# Patient Record
Sex: Female | Born: 1970 | Race: White | Hispanic: No | Marital: Married | Smoking: Never smoker
Health system: Southern US, Community
[De-identification: ages and names within clinical notes are randomized; demographics above are authoritative.]

## PROBLEM LIST (undated history)

## (undated) DIAGNOSIS — T8859XA Other complications of anesthesia, initial encounter: Secondary | ICD-10-CM

## (undated) DIAGNOSIS — Z87442 Personal history of urinary calculi: Secondary | ICD-10-CM

## (undated) DIAGNOSIS — Z9889 Other specified postprocedural states: Secondary | ICD-10-CM

## (undated) DIAGNOSIS — N200 Calculus of kidney: Secondary | ICD-10-CM

## (undated) DIAGNOSIS — I519 Heart disease, unspecified: Secondary | ICD-10-CM

## (undated) DIAGNOSIS — I872 Venous insufficiency (chronic) (peripheral): Secondary | ICD-10-CM

## (undated) DIAGNOSIS — K219 Gastro-esophageal reflux disease without esophagitis: Secondary | ICD-10-CM

## (undated) DIAGNOSIS — F32A Depression, unspecified: Secondary | ICD-10-CM

## (undated) DIAGNOSIS — N189 Chronic kidney disease, unspecified: Secondary | ICD-10-CM

## (undated) DIAGNOSIS — R112 Nausea with vomiting, unspecified: Secondary | ICD-10-CM

## (undated) DIAGNOSIS — I739 Peripheral vascular disease, unspecified: Secondary | ICD-10-CM

## (undated) DIAGNOSIS — Z8669 Personal history of other diseases of the nervous system and sense organs: Secondary | ICD-10-CM

## (undated) HISTORY — PX: TONSILLECTOMY: SUR1361

## (undated) HISTORY — DX: Venous insufficiency (chronic) (peripheral): I87.2

## (undated) HISTORY — DX: Calculus of kidney: N20.0

## (undated) HISTORY — PX: KIDNEY SURGERY: SHX687

## (undated) HISTORY — DX: Chronic kidney disease, unspecified: N18.9

## (undated) HISTORY — DX: Gastro-esophageal reflux disease without esophagitis: K21.9

## (undated) HISTORY — DX: Heart disease, unspecified: I51.9

## (undated) HISTORY — PX: BARIATRIC SURGERY: SHX1103

## (undated) NOTE — *Deleted (*Deleted)
PHARMACY CONSULT FOR:  Risk Assessment for Post-Discharge VTE Following Bariatric Surgery  Post-Discharge VTE Risk Assessment: This patient's probability of 30-day post-discharge VTE is increased due to the factors marked:   Female    Age >/=60 years    BMI >/=50 kg/m2    CHF    Dyspnea at Rest    Paraplegia  X  Non-gastric-band surgery    Operation Time >/=3 hr    Return to OR     Length of Stay >/= 3 d   Hx of VTE   Hypercoagulable condition  * Significant venous stasis       Predicted probability of 30-day post-discharge VTE: 0.16%  Other patient-specific factors to consider: venous insufficiency noted in patient's past medical history    Recommendation for Discharge: *If significant chronic venous insufficiency present, consider LMWH 40mg  SQ q12h x 2 to 4 weeks.    Jill Cochran is a 56 y.o. female who underwent laparoscopic repair of hiatal hernia, sleeve gastrectomy, and upper endoscopy on 03/25/2020.    Case start: 1331 Case end: 1458   Allergies  Allergen Reactions  . Ciprofloxacin Itching and Rash  . Sulfamethoxazole Itching and Rash    Patient Measurements: Height: 5\' 7"  (170.2 cm) Weight: 104.4 kg (230 lb 3.2 oz) IBW/kg (Calculated) : 61.6 Body mass index is 36.05 kg/m.  Recent Labs    03/25/20 1521  WBC 6.6  HGB 14.1  HCT 42.7  PLT 157  CREATININE 1.08*   Estimated Creatinine Clearance: 76.6 mL/min (A) (by C-G formula based on SCr of 1.08 mg/dL (H)).    Past Medical History:  Diagnosis Date  . Chronic renal failure    Secondary to Right  Kidney  renal calculus and GERD  . Complication of anesthesia   . Depression   . GERD (gastroesophageal reflux disease)   . History of kidney stones   . Peripheral vascular disease (HCC)    leaky vein in Rt leg. is now resolved  . PONV (postoperative nausea and vomiting)   . Sleep apnea 2021  . Venous insufficiency    at  [facility]     Medications Prior to Admission  Medication Sig  Dispense Refill Last Dose  . ALPRAZolam (XANAX) 0.25 MG tablet Take 0.25 mg by mouth daily as needed for anxiety.    03/24/2020 at Unknown time  . Cholecalciferol (VITAMIN D) 50 MCG (2000 UT) CAPS Take 2,000 Units by mouth daily.   03/23/2020  . FLUoxetine (PROZAC) 20 MG capsule Take 20 mg by mouth daily.   03/22/2020  . indapamide (LOZOL) 2.5 MG tablet Take 2.5 mg by mouth daily.    03/22/2020  . Multiple Vitamin (MULTIVITAMIN WITH MINERALS) TABS tablet Take 1 tablet by mouth daily.   03/24/2020 at Unknown time  . tretinoin (RETIN-A) 0.025 % cream Apply 1 application topically at bedtime.   03/24/2020 at Unknown time  . omeprazole (PRILOSEC) 10 MG capsule Take 10 mg by mouth daily as needed (reflux).  (Patient not taking: Reported on 03/14/2020)   Not Taking at Unknown time  . phentermine 30 MG capsule Take 30 mg by mouth every other day. (Patient not taking: Reported on 03/14/2020)  0 Not Taking at Unknown time       Jamse Mead 03/25/2020,4:44 PM

---

## 1999-06-28 ENCOUNTER — Emergency Department (HOSPITAL_COMMUNITY): Admission: EM | Admit: 1999-06-28 | Discharge: 1999-06-28 | Payer: Self-pay

## 1999-07-09 ENCOUNTER — Ambulatory Visit (HOSPITAL_COMMUNITY): Admission: RE | Admit: 1999-07-09 | Discharge: 1999-07-09 | Payer: Self-pay | Admitting: Sports Medicine

## 1999-07-09 ENCOUNTER — Encounter: Payer: Self-pay | Admitting: Sports Medicine

## 2000-10-19 ENCOUNTER — Other Ambulatory Visit: Admission: RE | Admit: 2000-10-19 | Discharge: 2000-10-19 | Payer: Self-pay | Admitting: *Deleted

## 2001-06-30 ENCOUNTER — Encounter: Payer: Self-pay | Admitting: *Deleted

## 2001-06-30 ENCOUNTER — Ambulatory Visit (HOSPITAL_COMMUNITY): Admission: RE | Admit: 2001-06-30 | Discharge: 2001-06-30 | Payer: Self-pay | Admitting: *Deleted

## 2001-12-16 ENCOUNTER — Other Ambulatory Visit: Admission: RE | Admit: 2001-12-16 | Discharge: 2001-12-16 | Payer: Self-pay | Admitting: Obstetrics and Gynecology

## 2002-06-05 ENCOUNTER — Inpatient Hospital Stay (HOSPITAL_COMMUNITY): Admission: RE | Admit: 2002-06-05 | Discharge: 2002-06-12 | Payer: Self-pay | Admitting: Urology

## 2002-06-05 ENCOUNTER — Encounter: Payer: Self-pay | Admitting: Urology

## 2002-06-06 ENCOUNTER — Encounter: Payer: Self-pay | Admitting: Urology

## 2002-06-10 ENCOUNTER — Encounter: Payer: Self-pay | Admitting: Urology

## 2002-06-12 ENCOUNTER — Encounter: Payer: Self-pay | Admitting: Urology

## 2002-06-26 ENCOUNTER — Encounter: Payer: Self-pay | Admitting: Urology

## 2002-06-26 ENCOUNTER — Ambulatory Visit (HOSPITAL_BASED_OUTPATIENT_CLINIC_OR_DEPARTMENT_OTHER): Admission: RE | Admit: 2002-06-26 | Discharge: 2002-06-26 | Payer: Self-pay | Admitting: Urology

## 2002-07-10 ENCOUNTER — Encounter: Payer: Self-pay | Admitting: Urology

## 2002-07-10 ENCOUNTER — Ambulatory Visit (HOSPITAL_BASED_OUTPATIENT_CLINIC_OR_DEPARTMENT_OTHER): Admission: RE | Admit: 2002-07-10 | Discharge: 2002-07-10 | Payer: Self-pay | Admitting: Urology

## 2002-10-24 ENCOUNTER — Other Ambulatory Visit: Admission: RE | Admit: 2002-10-24 | Discharge: 2002-10-24 | Payer: Self-pay | Admitting: Obstetrics & Gynecology

## 2003-03-04 ENCOUNTER — Inpatient Hospital Stay (HOSPITAL_COMMUNITY): Admission: AD | Admit: 2003-03-04 | Discharge: 2003-03-04 | Payer: Self-pay | Admitting: Obstetrics & Gynecology

## 2003-04-13 ENCOUNTER — Inpatient Hospital Stay (HOSPITAL_COMMUNITY): Admission: AD | Admit: 2003-04-13 | Discharge: 2003-04-13 | Payer: Self-pay | Admitting: Obstetrics & Gynecology

## 2003-04-25 ENCOUNTER — Inpatient Hospital Stay (HOSPITAL_COMMUNITY): Admission: AD | Admit: 2003-04-25 | Discharge: 2003-04-28 | Payer: Self-pay | Admitting: Obstetrics & Gynecology

## 2003-04-29 ENCOUNTER — Encounter: Admission: RE | Admit: 2003-04-29 | Discharge: 2003-05-29 | Payer: Self-pay | Admitting: Obstetrics & Gynecology

## 2003-06-04 ENCOUNTER — Other Ambulatory Visit: Admission: RE | Admit: 2003-06-04 | Discharge: 2003-06-04 | Payer: Self-pay | Admitting: Obstetrics & Gynecology

## 2003-06-29 ENCOUNTER — Encounter: Admission: RE | Admit: 2003-06-29 | Discharge: 2003-07-29 | Payer: Self-pay | Admitting: Obstetrics & Gynecology

## 2003-07-30 ENCOUNTER — Encounter: Admission: RE | Admit: 2003-07-30 | Discharge: 2003-08-29 | Payer: Self-pay | Admitting: Obstetrics & Gynecology

## 2003-09-27 ENCOUNTER — Encounter: Admission: RE | Admit: 2003-09-27 | Discharge: 2003-10-27 | Payer: Self-pay | Admitting: Obstetrics & Gynecology

## 2003-11-27 ENCOUNTER — Encounter: Admission: RE | Admit: 2003-11-27 | Discharge: 2003-12-27 | Payer: Self-pay | Admitting: Obstetrics & Gynecology

## 2004-01-27 ENCOUNTER — Encounter: Admission: RE | Admit: 2004-01-27 | Discharge: 2004-02-26 | Payer: Self-pay | Admitting: Obstetrics & Gynecology

## 2005-04-28 ENCOUNTER — Encounter: Admission: RE | Admit: 2005-04-28 | Discharge: 2005-04-28 | Payer: Self-pay | Admitting: Obstetrics and Gynecology

## 2005-10-15 ENCOUNTER — Ambulatory Visit (HOSPITAL_COMMUNITY): Admission: RE | Admit: 2005-10-15 | Discharge: 2005-10-15 | Payer: Self-pay | Admitting: Urology

## 2005-10-19 ENCOUNTER — Ambulatory Visit (HOSPITAL_COMMUNITY): Admission: RE | Admit: 2005-10-19 | Discharge: 2005-10-19 | Payer: Self-pay | Admitting: Urology

## 2006-06-08 HISTORY — PX: KIDNEY SURGERY: SHX687

## 2007-03-15 ENCOUNTER — Other Ambulatory Visit: Admission: RE | Admit: 2007-03-15 | Discharge: 2007-03-15 | Payer: Self-pay | Admitting: Obstetrics and Gynecology

## 2008-09-10 ENCOUNTER — Other Ambulatory Visit: Admission: RE | Admit: 2008-09-10 | Discharge: 2008-09-10 | Payer: Self-pay | Admitting: Obstetrics and Gynecology

## 2009-02-21 ENCOUNTER — Encounter: Admission: RE | Admit: 2009-02-21 | Discharge: 2009-02-21 | Payer: Self-pay | Admitting: Obstetrics and Gynecology

## 2009-04-22 ENCOUNTER — Ambulatory Visit (HOSPITAL_COMMUNITY): Admission: RE | Admit: 2009-04-22 | Discharge: 2009-04-22 | Payer: Self-pay | Admitting: Obstetrics and Gynecology

## 2010-02-24 ENCOUNTER — Encounter: Admission: RE | Admit: 2010-02-24 | Discharge: 2010-02-24 | Payer: Self-pay | Admitting: Family Medicine

## 2010-02-27 ENCOUNTER — Encounter: Admission: RE | Admit: 2010-02-27 | Discharge: 2010-02-27 | Payer: Self-pay | Admitting: Family Medicine

## 2010-06-29 ENCOUNTER — Encounter: Payer: Self-pay | Admitting: Family Medicine

## 2010-07-09 ENCOUNTER — Encounter (HOSPITAL_COMMUNITY): Payer: Self-pay | Admitting: Emergency Medicine

## 2010-07-09 ENCOUNTER — Ambulatory Visit (HOSPITAL_COMMUNITY)
Admission: EM | Admit: 2010-07-09 | Discharge: 2010-07-09 | Disposition: A | Discharge status: Home / Self Care | Payer: 59 | Attending: Emergency Medicine | Admitting: Emergency Medicine

## 2010-07-09 ENCOUNTER — Ambulatory Visit (HOSPITAL_COMMUNITY): Payer: 59

## 2010-07-09 DIAGNOSIS — Z87442 Personal history of urinary calculi: Secondary | ICD-10-CM | POA: Insufficient documentation

## 2010-07-09 DIAGNOSIS — R110 Nausea: Secondary | ICD-10-CM | POA: Insufficient documentation

## 2010-07-09 DIAGNOSIS — R1031 Right lower quadrant pain: Secondary | ICD-10-CM | POA: Insufficient documentation

## 2010-07-09 DIAGNOSIS — N949 Unspecified condition associated with female genital organs and menstrual cycle: Secondary | ICD-10-CM | POA: Insufficient documentation

## 2010-07-09 DIAGNOSIS — D739 Disease of spleen, unspecified: Secondary | ICD-10-CM | POA: Insufficient documentation

## 2010-07-09 MED ORDER — IOHEXOL 300 MG/ML  SOLN
100.0000 mL | Freq: Once | INTRAMUSCULAR | Status: DC | PRN
  Administered 2010-07-09: 100 mL via INTRAVENOUS
  Filled 2010-07-09: qty 100

## 2010-07-10 LAB — GC/CHLAMYDIA PROBE AMP, GENITAL: GC Probe Amp, Genital: NEGATIVE

## 2010-09-10 LAB — CBC
MCHC: 33.1 g/dL (ref 30.0–36.0)
MCV: 88.4 fL (ref 78.0–100.0)
Platelets: 194 10*3/uL (ref 150–400)
RBC: 4.91 MIL/uL (ref 3.87–5.11)

## 2010-09-10 LAB — BASIC METABOLIC PANEL
BUN: 10 mg/dL (ref 6–23)
CO2: 28 mEq/L (ref 19–32)
Calcium: 9.4 mg/dL (ref 8.4–10.5)
Chloride: 104 mEq/L (ref 96–112)
Creatinine, Ser: 0.94 mg/dL (ref 0.4–1.2)
GFR calc Af Amer: >60 mL/min (ref >60)
GFR calc non Af Amer: >60 mL/min (ref >60)
Glucose, Bld: 78 mg/dL (ref 70–99)
Potassium: 4 mEq/L (ref 3.5–5.1)
Sodium: 138 mEq/L (ref 135–145)

## 2010-09-10 LAB — HCG, SERUM, QUALITATIVE: Preg, Serum: NEGATIVE

## 2010-10-24 NOTE — Op Note (Signed)
NAME:  Jill Cochran, Jill Cochran                        ACCOUNT NO.:  [id number]   MEDICAL RECORD NO.:  [id number]                   PATIENT TYPE:  INP   LOCATION:  0343                                 FACILITY:  [facility]   PHYSICIAN:  Bertram Millard. Dahlstedt, M.D.          DATE OF BIRTH:  09-20-1970   DATE OF PROCEDURE:  06/05/2002  DATE OF DISCHARGE:                                 OPERATIVE REPORT   PREOPERATIVE DIAGNOSIS:  Large right staghorn calculus.   POSTOPERATIVE DIAGNOSIS:  Large right staghorn calculus.   PROCEDURE:  Percutaneous nephrolithotomy.   SURGEON:  Bertram Millard. Dahlstedt, M.D.   RADIOLOGIST:  Sherrine Maples T. Fredia Sorrow, M.D.   ANESTHESIA:  General endotracheal.   COMPLICATIONS:  None.   ESTIMATED BLOOD LOSS:  700 cc.   BRIEF HISTORY:  This 40 year old female was seen by me first about four to  five years ago with a right renal calculus.  She underwent lithotripsy.  She  had incomplete follow-up due to patient noncompliance.   She recently presented to my office with a history of recurrent urinary  tract infections.  Evaluation revealed the patient to have a staghorn  calculus occupying her whole upper collecting system on the right.  It was  recommended that she have combination therapy eventually.  However, with the  size of the stone it was recommended that we first start with percutaneous  access by the radiologist and percutaneous nephrolithotomy under anesthetic.  She is aware of this procedure as well as its attendant risks and  complications.  These include but are not limited to bleeding, infection,  loss of the kidney, need for further invasive procedures, and possible  nephrectomy.  She understands these and desires to proceed.   The patient has been kept on antibiotics until this hospitalization.   DESCRIPTION OF PROCEDURE:  The patient had a percutaneous nephrostomy placed  in the radiology department by Dr. Irish Lack.  He accompanied her to  the operating  room.  General anesthetic was administered, and the patient  was placed in the prone position.  Her torso and extremities were padded  appropriately.  A catheter had been placed in her bladder.   Dr. Fredia Sorrow dilated the percutaneous tract to approximately 30 French size.  A sheath was then placed into her lower pole calyx through the nephrostomy  tract.  The lithotrite and nephroscope were then placed.  It was quite  obvious that the stone was present right at the end of the access.  Using  the lithotrite, I then performed extraction of the stone.  The lower calyx  was first treated, then I worked my way to the pelvis of the kidney.  The  entire stone within the pelvis was removed.  I then guided,  under  fluoroscopic guidance, the scope up into the upper pole calyces.  I had to  torque the scope somewhat to gain access to the upper pole calyces.  However,  most of the stone in these upper pole calyces was ablated and  removed with the scope and the lithotrite.  All remaining stone within the  kidney was removed with the rigid instrument.  After some time, no further  stone was seen.  Fluoroscopic pictures showed most of the stone in the  majority of the kidney to be gone; however, the stones in the calyces were  still present.  I then used a flexible cystoscope through the sheath to  treat some of the calyceal stones with the holmium laser using the 360  micron fiber.  Unfortunately, I was unable to gain access to most of the  calyces due to the mild to moderate amount of blood present within the  kidney.  I felt that it was best after approximately two hours of treatment  to abort the procedure.  It was deemed necessary to have a second procedure,  either another percutaneous nephrolithotomy or lithotripsy.  At this point a  22 [place]-tip catheter was placed into the patient's pelvis using  fluoroscopic guidance by Dr. Fredia Sorrow.  The balloon was filled with 10 cc of  saline.   Additionally, a ureteral access sheath was placed under  fluoroscopic guidance.  At this point the sheath was removed and the  catheter sutured to the skin with 2-0 silk.  It was hooked to dependent  drainage.  Dry sterile dressings were placed.   The patient was awakened and extubated.  She was taken to the PACU in stable  condition.                                                Bertram Millard. Dahlstedt, M.D.    SMD/MEDQ  D:  06/06/2002  T:  06/06/2002  Job:  [id number]

## 2010-10-24 NOTE — Discharge Summary (Signed)
   NAME:  GABRIAL, POPPELL                        ACCOUNT NO.:  [id number]   MEDICAL RECORD NO.:  [id number]                   PATIENT TYPE:  INP   LOCATION:  0350                                 FACILITY:  [facility]   PHYSICIAN:  Bertram Millard. Dahlstedt, M.D.          DATE OF BIRTH:  1970-09-24   DATE OF ADMISSION:  06/05/2002  DATE OF DISCHARGE:  06/12/2002                                 DISCHARGE SUMMARY   DISCHARGE DIAGNOSIS:  Large right staghorn calculus.   PRINCIPAL PROCEDURES:  1. On 06/05/2002, right percutaneous nephrolithotomy.  2. On 06/10/2002, right percutaneous nephrolithotomy and double-J stent     placement.   BRIEF HISTORY:  The patient is a 40 year old female with a large right  staghorn calculus.  She is admitted at this time for percutaneous  nephrolithotomy.  She has a prior history of a kidney stone which was  treated with lithotripsy back in 1997.  She has had inadequate followup due  to noncompliance.  We recently diagnosed her with this large stone, and she  is admitted at this time for definitive management.   HOSPITAL COURSE:  The patient was admitted to the hospital on 06/05/2002.  She underwent initial percutaneous nephrostomy placement by Dr. Irish Lack.  After this was established, she underwent an operative procedure  which consisted of a right percutaneous nephrolithotomy.  She tolerated this  procedure well.  Her hematocrit was followed postoperatively.  It did get  into the mid-20s.  She did not receive any transfusions, however.  She did  have a mild temperature on the 06/08/2002.  This delayed her secondary  treatment.  It was performed, however, on 06/10/2002.  I felt like we debulked  most of the stone, approximately 80%.  A double-J stent was left indwelling.  She was discharged to home on 06/12/2002.  At that time she was afebrile, on a  regular diet.   DISCHARGE MEDICATIONS:  1. Cipro.  2. Darvocet.   FOLLOW UP:  She will follow up with me in  approximately one week and will  need eventual lithotripsy of the remaining stones.   DISCHARGE INSTRUCTIONS:  She was told to call for any questions or concerns.  She was also given instructions to clean and dry the old nephrostomy tube  site.  This was removed before she went home.                                               Bertram Millard. Dahlstedt, M.D.    SMD/MEDQ  D:  07/20/2002  T:  07/20/2002  Job:  [id number]

## 2010-10-24 NOTE — H&P (Signed)
NAME:  ARDIS, LAWLEY                        ACCOUNT NO.:  [id number]   MEDICAL RECORD NO.:  [id number]                   PATIENT TYPE:  INP   LOCATION:  9165                                 FACILITY:  [facility]   PHYSICIAN:  Freddy Finner, M.D.                DATE OF BIRTH:  1971/03/24   DATE OF ADMISSION:  04/25/2003  DATE OF DISCHARGE:                                HISTORY & PHYSICAL   ADMISSION DIAGNOSES:  1. Intrauterine pregnancy at 10 and 6/[redacted] weeks gestation.  2. Pregnancy induced hypertension.   HISTORY OF PRESENT ILLNESS:  The patient is a 40 year old white married  female Gravida III, Para 1 who has had an essentially uneventful pregnancy  until approximately two days prior to this admission.  She presented to the  office on that day for a routine examination and was found to have a blood  pressure elevated to 124/90 and a trace of proteinuria.  She had PIH  laboratories done on that day and was followed up today in the office again  with a blood pressure of 126/82 and a trace of proteinuria.  Baseline  pressures were in the 104/60 range.  She also complained of feeling  lightheaded and having mild headaches.  Her PIH laboratories were normal  except for platelets which have dropped to 127,000.  Based on her  gestational age of essentially 56 weeks and her constellation of physical  findings, laboratory data and subjective symptoms she is now admitted for  induction.   REVIEW OF SYMPTOMS:  Except as noted above is negative.   PAST MEDICAL HISTORY:  Recorded in prenatal data and will not be repeated.   FAMILY HISTORY:  Per prenatal data and will not be repeated.   PHYSICAL EXAMINATION:  HEENT:  Grossly within normal limits.  Thyroid gland  is not palpably enlarged.  VITAL SIGNS:  Blood pressure in the office was 126/82.  CHEST:  Clear to auscultation throughout.  HEART:  Normal sinus rhythm without murmurs, rubs or gallops.  ABDOMEN:  Gravid, estimated fetal size of 7.5  to 8.0 pounds.  EXTREMITIES:  +1 edema, +1 deep tendon reflexes and no clonus.   ASSESSMENT:  1. Intrauterine pregnancy at 37 and 6/[redacted] weeks gestation.  2. Pregnancy induced hypertension.  3. Her cervix is unfavorable at approximately 50% effaced, closed and vertex     at -3.   PLAN:  The patient is admitted for further management and two stage  induction.                                               Freddy Finner, M.D.    WRN/MEDQ  D:  04/25/2003  T:  04/25/2003  Job:  [id number]

## 2010-10-24 NOTE — H&P (Signed)
   NAME:  Jill Cochran, Jill Cochran                        ACCOUNT NO.:  [id number]   MEDICAL RECORD NO.:  [id number]                   PATIENT TYPE:  INP   LOCATION:  0350                                 FACILITY:  [facility]   PHYSICIAN:  Bertram Millard. Dahlstedt, M.D.          DATE OF BIRTH:  09-Sep-1970   DATE OF ADMISSION:  06/05/2002  DATE OF DISCHARGE:  06/12/2002                                HISTORY & PHYSICAL   BRIEF HISTORY:  A 40 year old female with a history of a large staghorn  calculus in her right kidney.  She initially presented to my office several  years ago and received lithotripsy for modest sized stone.  Follow-up was  incomplete by the patient.  She has had recurrent UTIs over the past year  and evaluation revealed a very large right staghorn calculus.  She has been  on antibiotics for some time.  It was recommended that she undergo  percutaneous nephrolithotomy as primary treatment of this stone.  She is  admitted at this time for percutaneous access by [facility] T. Fredia Sorrow, M.D. and  eventual debulking of the stone with percutaneous nephrolithotomy.   Other options of stone management were discussed with her, specifically  nephrectomy versus multiple lithotripsies.  She tolerates lithotripsies with  difficulty.  It is felt that lithotripsy should be a secondary treatment.   PAST MEDICAL HISTORY:  Significant for lithotripsy in approximately 1997.   SOCIAL HISTORY:  She has one child.  She is married for a second time.  She  does not use alcohol or tobacco.  She works for the [employer] as a  Scientist, clinical (histocompatibility and immunogenetics).   MEDICATIONS:  Cipro daily.   ALLERGIES:  She gets a rash from SULFA.   REVIEW OF SYSTEMS:  Noncontributory.   FAMILY HISTORY:  Noncontributory.   PHYSICAL EXAMINATION:  GENERAL:  Very pleasant, healthy appearing adult  female.  VITAL SIGNS:  Blood pressure 98/8, pulse 79, respiratory rate 16, blood  pressure 150/85, weight 185 pounds.  HEENT:  Normal.  NECK:   Supple without thyromegaly or adenopathy.  CHEST:  Clear bilaterally.  HEART:  Regular rate and rhythm.  ABDOMEN:  Mildly protuberant, soft, nondistended with mild right CVA  tenderness.  No hepatosplenomegaly or masses were noted.  GENITOURINARY:  External genitalia unremarkable.  EXTREMITIES:  Without clubbing, cyanosis, edema.   LABORATORIES:  Essentially normal.    IMPRESSION:  Right staghorn calculus, large.   PLAN:  Percutaneous nephrostomy tube placement initially with eventual  percutaneous nephrolithotomy.                                               Bertram Millard. Dahlstedt, M.D.    SMD/MEDQ  D:  07/20/2002  T:  07/20/2002  Job:  [id number]

## 2010-10-24 NOTE — Op Note (Signed)
NAME:  Jill Cochran, Jill Cochran                        ACCOUNT NO.:  [id number]   MEDICAL RECORD NO.:  [id number]                   PATIENT TYPE:  INP   LOCATION:  0350                                 [facility]ACILITY:  [facility]   PHYSICIAN:  Bertram Millard. Dahlstedt, M.D.          DATE OF BIRTH:  09/20/1970   DATE OF PROCEDURE:  06/10/2002  DATE OF DISCHARGE:                                 OPERATIVE REPORT   PREOPERATIVE DIAGNOSES:  Right staghorn calculus, large, status post  principal procedure of percutaneous nephrolithotomy.   POSTOPERATIVE DIAGNOSES:  Right staghorn calculus, large, status post  principal procedure of percutaneous nephrolithotomy.   PRIMARY PROCEDURE:  ]  Second look percutaneous nephrolithotomy on right, double J stent  placement, interpretive fluoroscopy.   SURGEON:  Bertram Millard. Dahlstedt, M.D.   ANESTHESIA:  General endotracheal.   COMPLICATIONS:  None.   BRIEF HISTORY:  A 40 year old female status post first procedure  percutaneous nephrolithotomy five days ago. She has been in the hospital  since then, and when the second procedure was planned three days ago became  febrile. For that matter, her case was cancelled. It was postponed until  today. She presents for percutaneous nephrolithotomy for remaining caliceal  stones. The risks and complications of the procedure have been discussed  with the patient, and they are understood.   DESCRIPTION OF PROCEDURE:  The patient was administered a general  endotracheal anesthetic and was placed in the prone position. Her abdomen  and torso as well as extremities were padded appropriately. Her right flank  was prepped and draped around the previously placed nephrostomy tubes. The  fluoroscopic unit was used to guide another guidewire down into the bladder.  A second guidewire was also advanced into the bladder through the  percutaneous site. The safety catheter and the council-tip catheter were  then removed. Over top of a Office manager, a 30 French sheath was placed in  the renal pelvis. The rigid nephroscope was then used to inspect. I was able  to see a fairly large stone in the lower pole calix. This was oblated and  aspirated with the lithotrite. I then inspected the rest of the kidney. I  was unable to attain access to any of the caliceal stones due to limitations  of the rigid scope. I then placed the flexible cystoscope with the 365  micron laser fiber. I accessed all the upper pole calices and the mid  calices. The stones in these areas, which were quite large, were oblated  using the laser fiber. They were oblated into small little fragments which  more then likely irrigated out of the pelvis. Using fluoroscopic guidance, I  was able to see that all of the upper pole and mid aspect caliceal stones  were oblated. I then turned attention of the cystoscope to the proximal  ureter where the previously noted ureteral fragment was oblated with the  laser fiber.   After reinspection  of the entire pelvis with the rigid nephroscope, small  stone fragments were aspirated. No larger fragments were seen. At this  point, I felt the patient had reached maximal benefit of the procedure. I  placed a 24 cm x 7 [place] double J stent over the previously placed  guidewire using fluoroscopic guidance. The council-tip catheter was then  replaced in the renal pelvis and the balloon filled with 10 cc of saline.  The pelvis was then irrigated with saline. The catheter was sutured to the  skin using a #0 silk suture. An ostomy bag was placed over the catheter  site.   The procedure was then terminated. The patient tolerated the procedure well.  She was awakened, extubated, and taken to the PACU in stable condition.                                               Bertram Millard. Dahlstedt, M.D.    SMD/MEDQ  D:  06/10/2002  T:  06/10/2002  Job:  [id number]

## 2011-01-30 ENCOUNTER — Other Ambulatory Visit: Payer: Self-pay | Admitting: Family Medicine

## 2011-01-30 DIAGNOSIS — Z1231 Encounter for screening mammogram for malignant neoplasm of breast: Secondary | ICD-10-CM

## 2011-02-20 ENCOUNTER — Other Ambulatory Visit: Payer: Self-pay | Admitting: Family Medicine

## 2011-02-20 DIAGNOSIS — N63 Unspecified lump in unspecified breast: Secondary | ICD-10-CM

## 2011-02-27 ENCOUNTER — Other Ambulatory Visit: Payer: 59

## 2011-03-04 ENCOUNTER — Ambulatory Visit
Admission: RE | Admit: 2011-03-04 | Discharge: 2011-03-04 | Disposition: A | Discharge status: Home / Self Care | Payer: 59 | Source: Ambulatory Visit | Attending: Family Medicine | Admitting: Family Medicine

## 2011-03-04 ENCOUNTER — Ambulatory Visit: Payer: 59

## 2011-03-04 ENCOUNTER — Other Ambulatory Visit: Payer: Self-pay | Admitting: Family Medicine

## 2011-03-04 DIAGNOSIS — N63 Unspecified lump in unspecified breast: Secondary | ICD-10-CM

## 2011-03-04 DIAGNOSIS — Z1231 Encounter for screening mammogram for malignant neoplasm of breast: Secondary | ICD-10-CM

## 2011-03-06 ENCOUNTER — Ambulatory Visit
Admission: RE | Admit: 2011-03-06 | Discharge: 2011-03-06 | Disposition: A | Discharge status: Home / Self Care | Payer: 59 | Source: Ambulatory Visit | Attending: Family Medicine | Admitting: Family Medicine

## 2011-03-06 ENCOUNTER — Other Ambulatory Visit: Payer: Self-pay | Admitting: Family Medicine

## 2011-03-06 DIAGNOSIS — N63 Unspecified lump in unspecified breast: Secondary | ICD-10-CM

## 2011-12-03 ENCOUNTER — Other Ambulatory Visit: Payer: Self-pay | Admitting: Cardiology

## 2011-12-03 ENCOUNTER — Encounter (INDEPENDENT_AMBULATORY_CARE_PROVIDER_SITE_OTHER): Payer: 59

## 2011-12-03 DIAGNOSIS — M79609 Pain in unspecified limb: Secondary | ICD-10-CM

## 2011-12-03 DIAGNOSIS — M7989 Other specified soft tissue disorders: Secondary | ICD-10-CM

## 2011-12-08 ENCOUNTER — Other Ambulatory Visit: Payer: Self-pay

## 2011-12-08 DIAGNOSIS — I83893 Varicose veins of bilateral lower extremities with other complications: Secondary | ICD-10-CM

## 2012-02-01 ENCOUNTER — Encounter: Payer: Self-pay | Admitting: Vascular Surgery

## 2012-02-04 ENCOUNTER — Encounter: Payer: 59 | Admitting: Vascular Surgery

## 2012-02-05 ENCOUNTER — Other Ambulatory Visit: Payer: Self-pay | Admitting: Family Medicine

## 2012-02-05 DIAGNOSIS — Z1231 Encounter for screening mammogram for malignant neoplasm of breast: Secondary | ICD-10-CM

## 2012-03-07 ENCOUNTER — Ambulatory Visit: Payer: 59

## 2012-03-31 ENCOUNTER — Ambulatory Visit: Payer: 59

## 2012-04-07 ENCOUNTER — Ambulatory Visit
Admission: RE | Admit: 2012-04-07 | Discharge: 2012-04-07 | Disposition: A | Discharge status: Home / Self Care | Payer: 59 | Source: Ambulatory Visit | Attending: Family Medicine | Admitting: Family Medicine

## 2012-04-07 DIAGNOSIS — Z1231 Encounter for screening mammogram for malignant neoplasm of breast: Secondary | ICD-10-CM

## 2012-06-30 ENCOUNTER — Other Ambulatory Visit: Payer: Self-pay | Admitting: Family Medicine

## 2012-06-30 DIAGNOSIS — R51 Headache: Secondary | ICD-10-CM

## 2012-07-02 ENCOUNTER — Ambulatory Visit
Admission: RE | Admit: 2012-07-02 | Discharge: 2012-07-02 | Disposition: A | Discharge status: Home / Self Care | Payer: 59 | Source: Ambulatory Visit | Attending: Family Medicine | Admitting: Family Medicine

## 2012-07-02 DIAGNOSIS — R51 Headache: Secondary | ICD-10-CM

## 2013-01-24 ENCOUNTER — Other Ambulatory Visit: Payer: Self-pay | Admitting: *Deleted

## 2013-01-24 DIAGNOSIS — I83893 Varicose veins of bilateral lower extremities with other complications: Secondary | ICD-10-CM

## 2013-02-01 ENCOUNTER — Encounter: Payer: Self-pay | Admitting: Vascular Surgery

## 2013-02-02 ENCOUNTER — Encounter (INDEPENDENT_AMBULATORY_CARE_PROVIDER_SITE_OTHER): Payer: 59 | Admitting: *Deleted

## 2013-02-02 ENCOUNTER — Ambulatory Visit (INDEPENDENT_AMBULATORY_CARE_PROVIDER_SITE_OTHER): Payer: 59 | Admitting: Vascular Surgery

## 2013-02-02 ENCOUNTER — Other Ambulatory Visit: Payer: Self-pay | Admitting: *Deleted

## 2013-02-02 ENCOUNTER — Encounter: Payer: Self-pay | Admitting: Vascular Surgery

## 2013-02-02 VITALS — BP 140/87 | HR 70 | Resp 16 | Ht 68.0 in | Wt 238.0 lb

## 2013-02-02 DIAGNOSIS — I83893 Varicose veins of bilateral lower extremities with other complications: Secondary | ICD-10-CM | POA: Insufficient documentation

## 2013-02-02 NOTE — Progress Notes (Signed)
[facility] [place]   Patient name: Jill Cochran MRN: [id number] DOB: Nov 13, 1970 Sex: female   Referred by: Hyacinth Meeker  Reason for referral:  Chief Complaint  Patient presents with  . New Evaluation    right leg pain and swelling  referred by Dr Thurston Hole    HISTORY OF PRESENT ILLNESS: The patient has today for evaluation of right leg swelling. She is a very pleasant 42 year old with many year history of progressive swelling in her right leg. She does not noticed swelling in her left leg. This is somewhat better in the morning when she first arises the by the end of the day he has continued swelling. She does have a history of mild renal insufficiency and history of prior right renal stones. She felt this may be associated. She has no history of DVT no history of bleeding and no history of venous varicosities. She does have a maternal family history of venous disorders.  Past Medical History  Diagnosis Date  . Venous insufficiency     at  [facility]  . GERD (gastroesophageal reflux disease)   . Diabetes mellitus   . Heart disease   . Chronic renal failure     Secondary to Right  Kidney  renal calculus and GERD    Past Surgical History  Procedure Laterality Date  . Kidney surgery      History   Social History  . Marital Status: Married    Spouse Name: N/A    Number of Children: N/A  . Years of Education: N/A   Occupational History  . Not on file.   Social History Main Topics  . Smoking status: Never Smoker   . Smokeless tobacco: Never Used  . Alcohol Use: Yes  . Drug Use: No  . Sexual Activity: Not on file   Other Topics Concern  . Not on file   Social History Narrative  . No narrative on file    Family History  Problem Relation Age of Onset  . Diabetes Mother   . Heart disease Mother   . Diabetes Father   . Heart disease Father     Allergies as of 02/02/2013 - Review Complete 02/02/2013  Allergen Reaction Noted  . Sulfa drugs  cross reactors  02/01/2012    Current Outpatient Prescriptions on File Prior to Visit  Medication Sig Dispense Refill  . omeprazole (PRILOSEC) 10 MG capsule Take 10 mg by mouth daily.       No current facility-administered medications on file prior to visit.     REVIEW OF SYSTEMS:  Positives indicated with an "X"  CARDIOVASCULAR:  [ ]  chest pain   [ ]  chest pressure   [ ]  palpitations   [ ]  orthopnea   [ ]  dyspnea on exertion   [ ]  claudication   [ ]  rest pain   [ ]  DVT   [ ]  phlebitis PULMONARY:   [ ]  productive cough   [ ]  asthma   [ ]  wheezing NEUROLOGIC:   [x ] weakness  [ ]  paresthesias  [ ]  aphasia  [ ]  amaurosis  [x ] dizziness HEMATOLOGIC:   [ ]  bleeding problems   [ ]  clotting disorders MUSCULOSKELETAL:  [ ]  joint pain   [ ]  joint swelling GASTROINTESTINAL: [ ]   blood in stool  [ ]   hematemesis GENITOURINARY:  [ ]   dysuria  [ ]   hematuria PSYCHIATRIC:  [ ]  history of major depression INTEGUMENTARY:  [ ]  rashes  [ ]   ulcers CONSTITUTIONAL:  [ ]  fever   [ ]  chills  PHYSICAL EXAMINATION:  General: The patient is a well-nourished female, in no acute distress. Vital signs are BP 140/87  Pulse 70  Resp 16  Ht 5\' 8"  (1.727 m)  Wt 238 lb (107.956 kg)  BMI 36.2 kg/m2 Pulmonary: There is a good air exchange     Musculoskeletal: There are no major deformities.  There is no significant extremity pain. Neurologic: No focal weakness or paresthesias are detected, Skin: There are no ulcer or rashes noted. Psychiatric: The patient has normal affect.  Pulse status 2+ radial and 2+ dorsalis pedis pulses bilaterally No evidence of venous hypertension changes.   VVS Vascular Lab Studies:  Ordered and Independently Reviewed right leg a duplex shows no evidence of significant deep venous reflux. She does have a distended saphenous vein throughout its course with gross reflux throughout its course  Impression and Plan:  Venous hypertension with swelling and right leg. I  explained the significance of this. I explained the importance of elevation and ibuprofen for discomfort. Also today we fitted her with thigh high 20-30 mm mercury graduated compression garments instructed on the use of these. We will see her again in 3 months for continued discussion. She does understand that she could have relief with laser ablation of her great saphenous vein should she have failure of conservative treatment    [facility] Hem[facility] [place] Office: [phone]

## 2013-03-02 ENCOUNTER — Other Ambulatory Visit: Payer: Self-pay

## 2013-03-02 DIAGNOSIS — Z1231 Encounter for screening mammogram for malignant neoplasm of breast: Secondary | ICD-10-CM

## 2013-04-05 ENCOUNTER — Ambulatory Visit
Admission: RE | Admit: 2013-04-05 | Discharge: 2013-04-05 | Disposition: A | Discharge status: Home / Self Care | Payer: 59 | Source: Ambulatory Visit

## 2013-04-05 DIAGNOSIS — Z1231 Encounter for screening mammogram for malignant neoplasm of breast: Secondary | ICD-10-CM

## 2013-04-10 ENCOUNTER — Ambulatory Visit: Payer: 59

## 2013-05-08 ENCOUNTER — Encounter: Payer: Self-pay | Admitting: Vascular Surgery

## 2013-05-09 ENCOUNTER — Ambulatory Visit: Payer: 59 | Admitting: Vascular Surgery

## 2013-07-03 ENCOUNTER — Encounter: Payer: Self-pay | Admitting: Vascular Surgery

## 2013-07-04 ENCOUNTER — Ambulatory Visit: Payer: 59 | Admitting: Vascular Surgery

## 2013-07-05 ENCOUNTER — Other Ambulatory Visit: Payer: Self-pay | Admitting: Family Medicine

## 2013-07-05 ENCOUNTER — Ambulatory Visit
Admission: RE | Admit: 2013-07-05 | Discharge: 2013-07-05 | Disposition: A | Discharge status: Home / Self Care | Payer: 59 | Source: Ambulatory Visit | Attending: Family Medicine | Admitting: Family Medicine

## 2013-07-05 DIAGNOSIS — N12 Tubulo-interstitial nephritis, not specified as acute or chronic: Secondary | ICD-10-CM

## 2013-11-13 ENCOUNTER — Encounter: Payer: Self-pay | Admitting: Vascular Surgery

## 2013-11-14 ENCOUNTER — Encounter: Payer: Self-pay | Admitting: Vascular Surgery

## 2013-11-14 ENCOUNTER — Ambulatory Visit (INDEPENDENT_AMBULATORY_CARE_PROVIDER_SITE_OTHER): Payer: 59 | Admitting: Vascular Surgery

## 2013-11-14 VITALS — BP 132/87 | HR 89 | Resp 18 | Ht 69.0 in | Wt 235.0 lb

## 2013-11-14 DIAGNOSIS — I83893 Varicose veins of bilateral lower extremities with other complications: Secondary | ICD-10-CM

## 2013-11-14 NOTE — Progress Notes (Signed)
Problems with Activities of Daily Living Secondary to Leg Pain  1. [name] states that exercise is very difficult for her due to leg pain.  2. [name] states that all activities that require prolonged standing (cooking, cleaning, shopping) are very difficult due to leg pain.    3. [name] states that travel is difficult due to leg pain and swelling.   Failure of  Conservative Therapy:  1. Worn 20-30 mm Hg thigh high compression hose >3 months with no relief of symptoms.  2. Frequently elevates legs-no relief of symptoms  3. Taken Ibuprofen 600 Mg TID with no relief of symptoms.  Patient is here today for continued discussion of her right leg swelling and heaviness. She has been compliant with her compression garments and elevation but continues to have discomfort despite this. She does not have any swelling in her left leg. She reports this is worse at the end of the long work day. She has no history of DVT.  On physical exam she does have some moderate swelling on the right. No significant varicosities or telangiectasia. She has 2+ dorsalis pedis pulses bilaterally  I reimaged her right great saphenous vein with SonoSite and this does show enlargement throughout the course of this with multiple small branches arising from it.  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 I recommended laser ablation of her great saphenous vein from per for swelling and heaviness an achy sensation. I did describe the procedure in the very slight risk of DVT associated with the procedure. She wished to proceed as soon as possible.

## 2013-11-22 ENCOUNTER — Encounter: Payer: Self-pay | Admitting: Vascular Surgery

## 2013-11-23 ENCOUNTER — Encounter (HOSPITAL_COMMUNITY): Payer: 59

## 2013-11-23 ENCOUNTER — Ambulatory Visit: Payer: 59 | Admitting: Vascular Surgery

## 2014-03-08 ENCOUNTER — Ambulatory Visit
Admission: RE | Admit: 2014-03-08 | Discharge: 2014-03-08 | Disposition: A | Discharge status: Home / Self Care | Payer: 59 | Source: Ambulatory Visit | Attending: Allergy and Immunology | Admitting: Allergy and Immunology

## 2014-03-08 ENCOUNTER — Other Ambulatory Visit: Payer: Self-pay | Admitting: Allergy and Immunology

## 2014-03-08 DIAGNOSIS — R059 Cough, unspecified: Secondary | ICD-10-CM

## 2014-03-08 DIAGNOSIS — R05 Cough: Secondary | ICD-10-CM

## 2014-04-11 ENCOUNTER — Other Ambulatory Visit: Payer: Self-pay

## 2014-04-11 DIAGNOSIS — Z1231 Encounter for screening mammogram for malignant neoplasm of breast: Secondary | ICD-10-CM

## 2014-05-10 ENCOUNTER — Ambulatory Visit
Admission: RE | Admit: 2014-05-10 | Discharge: 2014-05-10 | Disposition: A | Discharge status: Home / Self Care | Payer: 59 | Source: Ambulatory Visit

## 2014-05-10 ENCOUNTER — Encounter (INDEPENDENT_AMBULATORY_CARE_PROVIDER_SITE_OTHER): Payer: Self-pay

## 2014-05-10 DIAGNOSIS — Z1231 Encounter for screening mammogram for malignant neoplasm of breast: Secondary | ICD-10-CM

## 2014-05-11 ENCOUNTER — Other Ambulatory Visit: Payer: Self-pay | Admitting: Family Medicine

## 2014-05-11 DIAGNOSIS — R928 Other abnormal and inconclusive findings on diagnostic imaging of breast: Secondary | ICD-10-CM

## 2014-05-25 ENCOUNTER — Ambulatory Visit
Admission: RE | Admit: 2014-05-25 | Discharge: 2014-05-25 | Disposition: A | Discharge status: Home / Self Care | Payer: 59 | Source: Ambulatory Visit | Attending: Family Medicine | Admitting: Family Medicine

## 2014-05-25 DIAGNOSIS — R928 Other abnormal and inconclusive findings on diagnostic imaging of breast: Secondary | ICD-10-CM

## 2014-07-16 ENCOUNTER — Ambulatory Visit (INDEPENDENT_AMBULATORY_CARE_PROVIDER_SITE_OTHER): Payer: 59

## 2014-07-16 ENCOUNTER — Encounter: Payer: Self-pay | Admitting: Podiatry

## 2014-07-16 ENCOUNTER — Other Ambulatory Visit: Payer: Self-pay | Admitting: Podiatry

## 2014-07-16 ENCOUNTER — Ambulatory Visit (INDEPENDENT_AMBULATORY_CARE_PROVIDER_SITE_OTHER): Payer: 59 | Admitting: Podiatry

## 2014-07-16 VITALS — BP 136/89 | HR 76 | Resp 16

## 2014-07-16 DIAGNOSIS — M722 Plantar fascial fibromatosis: Secondary | ICD-10-CM

## 2014-07-16 NOTE — Patient Instructions (Signed)

## 2014-07-16 NOTE — Progress Notes (Signed)
   Subjective:    Patient ID: Jill Cochran, female    DOB: 1970/11/15, 44 y.o.   MRN: [id number]  HPI Comments: 2nd Opinion  "  Patient c/o aching plantar heel left for about 1-2 years. AM pain and worse throughout the night if having to get up. She is has been seeing podiatrist and had treatment. She has been on NSAIDS, tried ice, boots, steroid injections-no help. Right foot did get better with those treatments.  Foot Pain      Review of Systems  Cardiovascular: Positive for leg swelling.  All other systems reviewed and are negative.      Objective:   Physical Exam        Assessment & Plan:

## 2014-07-17 NOTE — Progress Notes (Signed)
Subjective:     Patient ID: Jill Cochran, female   DOB: 07/16/1970, 44 y.o.   MRN: [id number]  HPI patient presents stating I been having this problem for about 2 years and it's been getting worse as time goes on. I cannot do any form of activity or exercise and I have gained weight and the pain is worse when I get up in the morning after sitting but hurts at all times. I've had previous injection boots immobilization without relief of symptoms   Review of Systems  All other systems reviewed and are negative.      Objective:   Physical Exam  Constitutional: She is oriented to person, place, and time.  Cardiovascular: Intact distal pulses.   Musculoskeletal: Normal range of motion.  Neurological: She is oriented to person, place, and time.  Skin: Skin is warm.  Nursing note and vitals reviewed.  neurovascular status found to be intact with muscle strength adequate and range of motion subtalar and midtarsal joint within normal limits. Patient is noted to have mild equinus condition and is noted to have quite a bit of discomfort with palpation of the medial fascial band left with severe pain when I pressed deep into the fascia itself area patient has good digital perfusion is well oriented 3     Assessment:     Plantar fasciitis of an acute nature left with a chronic nature to the length of time she's had it with no indication currently of any response to numerous conservative treatments over the last year    Plan:     Severe plantar fasciitis of the left heel that has failed to respond to numerous conservative treatments was discussed today. We discussed treatment options including continued conservative care versus shockwave versus surgery and due to her long-standing nature of this problem and severe discomfort she has opted for surgical intervention. Patient wants surgery and is given all instructions for this and at this time she read the consent form going over all possible  complications and alternative treatments. She is scheduled for endoscopic surgery left and is encouraged to call with any questions and is dispensed air fracture [name] with instructions on usage

## 2014-07-24 DIAGNOSIS — M722 Plantar fascial fibromatosis: Secondary | ICD-10-CM

## 2014-07-30 ENCOUNTER — Ambulatory Visit (INDEPENDENT_AMBULATORY_CARE_PROVIDER_SITE_OTHER): Payer: 59 | Admitting: Podiatry

## 2014-07-30 ENCOUNTER — Encounter: Payer: Self-pay | Admitting: Podiatry

## 2014-07-30 VITALS — BP 130/78 | HR 72 | Resp 16

## 2014-07-30 DIAGNOSIS — M722 Plantar fascial fibromatosis: Secondary | ICD-10-CM

## 2014-07-30 NOTE — Progress Notes (Signed)
Subjective:     Patient ID: Jill Cochran, female   DOB: Sep 22, 1970, 44 y.o.   MRN: [id number]  HPI patient states that I'm doing fine but I was just concerned because I have trouble wearing that boot at all times   Review of Systems     Objective:   Physical Exam Neurovascular status intact negative Homans sign noted with stitches intact plantar medial lateral aspect right foot with wound edges well coapted    Assessment:     Doing well post endoscopic fasciotomy right    Plan:     H&P performed and at this time I recommended night splint to use while sleeping and in the evening and continue to wear the boot during the day. This was dispensed and patient will reappoint in 2 weeks for stitch removal or earlier if any issues should occur

## 2014-08-13 ENCOUNTER — Ambulatory Visit (INDEPENDENT_AMBULATORY_CARE_PROVIDER_SITE_OTHER): Payer: 59 | Admitting: Podiatry

## 2014-08-13 ENCOUNTER — Encounter: Payer: Self-pay | Admitting: Podiatry

## 2014-08-13 VITALS — BP 125/74 | HR 79 | Resp 12

## 2014-08-13 DIAGNOSIS — M722 Plantar fascial fibromatosis: Secondary | ICD-10-CM

## 2014-08-13 DIAGNOSIS — R609 Edema, unspecified: Secondary | ICD-10-CM

## 2014-08-14 NOTE — Progress Notes (Signed)
Subjective:     Patient ID: Jill Cochran, female   DOB: Oct 05, 1970, 44 y.o.   MRN: [id number]  HPI patient states left foot still doing well but still having discomfort if him on it too much   Review of Systems     Objective:   Physical Exam Neurovascular status intact with incisions healing well left foot with wound edges well coapted    Assessment:     Doing well post endoscopic left with negative Homans sign noted    Plan:     Suture removal accomplished instructed on gradual increase in activity and advised on stretching exercises boot usage and ice

## 2014-08-30 ENCOUNTER — Other Ambulatory Visit: Payer: Self-pay | Admitting: Obstetrics and Gynecology

## 2014-08-30 DIAGNOSIS — R102 Pelvic and perineal pain: Secondary | ICD-10-CM

## 2014-08-31 ENCOUNTER — Ambulatory Visit
Admission: RE | Admit: 2014-08-31 | Discharge: 2014-08-31 | Disposition: A | Discharge status: Home / Self Care | Payer: Self-pay | Source: Ambulatory Visit | Attending: Obstetrics and Gynecology | Admitting: Obstetrics and Gynecology

## 2014-08-31 ENCOUNTER — Ambulatory Visit
Admission: RE | Admit: 2014-08-31 | Discharge: 2014-08-31 | Disposition: A | Discharge status: Home / Self Care | Payer: 59 | Source: Ambulatory Visit | Attending: Obstetrics and Gynecology | Admitting: Obstetrics and Gynecology

## 2014-08-31 DIAGNOSIS — R102 Pelvic and perineal pain: Secondary | ICD-10-CM

## 2014-09-17 ENCOUNTER — Encounter: Payer: Self-pay | Admitting: Podiatry

## 2014-09-17 ENCOUNTER — Ambulatory Visit (INDEPENDENT_AMBULATORY_CARE_PROVIDER_SITE_OTHER): Payer: 59 | Admitting: Podiatry

## 2014-09-17 VITALS — BP 141/97 | HR 74 | Resp 15

## 2014-09-17 DIAGNOSIS — M722 Plantar fascial fibromatosis: Secondary | ICD-10-CM

## 2014-09-18 NOTE — Progress Notes (Signed)
Subjective:     Patient ID: Jill Cochran, female   DOB: 1970/06/23, 44 y.o.   MRN: [id number]  HPI patient states I'm doing well with minimal discomfort if I been on my foot for to long   Review of Systems     Objective:   Physical Exam Neurovascular status intact muscle strength adequate with wound edges well coapted and minimal discomfort when the plantar foot left is palpated heel or arch    Assessment:     Doing well post endoscopic surgery left    Plan:     Reviewed continued conservative care and gradual increase in activities over the next month. If symptoms were to occur patient's to let us know

## 2014-11-07 ENCOUNTER — Encounter: Payer: Self-pay | Admitting: Podiatry

## 2014-11-12 ENCOUNTER — Telehealth: Payer: Self-pay | Admitting: *Deleted

## 2014-11-12 NOTE — Telephone Encounter (Signed)
Pt request handicap sticker extension for 3 mos.  I spoke with pt, to inform the placard was ready to be picked up and pt asked to be scheduled to be seen.  Pt was transferred to schedulers.

## 2014-12-19 ENCOUNTER — Encounter: Payer: Commercial Managed Care - HMO | Admitting: Podiatry

## 2014-12-19 ENCOUNTER — Telehealth: Payer: Self-pay | Admitting: *Deleted

## 2014-12-19 NOTE — Telephone Encounter (Signed)
Pt states she had endoscopic plantar fasciotomy about 4 months ago, now has severe top of the foot pain after resting and sometimes through the day.  Pt states it is too hot to go back into the surgical boot, so began the plantar fascial strap again and had great results.  I informed pt, that often after EPF the foot still needs support and orthotics maybe the best support she can have.  I transferred pt to schedulers.

## 2014-12-20 ENCOUNTER — Ambulatory Visit: Payer: Commercial Managed Care - HMO | Admitting: Podiatry

## 2014-12-25 ENCOUNTER — Other Ambulatory Visit: Payer: Self-pay | Admitting: Family Medicine

## 2014-12-25 DIAGNOSIS — N644 Mastodynia: Secondary | ICD-10-CM

## 2015-01-01 ENCOUNTER — Ambulatory Visit
Admission: RE | Admit: 2015-01-01 | Discharge: 2015-01-01 | Disposition: A | Discharge status: Home / Self Care | Payer: Commercial Managed Care - HMO | Source: Ambulatory Visit | Attending: Family Medicine | Admitting: Family Medicine

## 2015-01-01 DIAGNOSIS — N644 Mastodynia: Secondary | ICD-10-CM

## 2015-01-08 ENCOUNTER — Ambulatory Visit: Payer: Commercial Managed Care - HMO | Admitting: Podiatry

## 2015-01-09 ENCOUNTER — Ambulatory Visit: Payer: Commercial Managed Care - HMO | Admitting: Podiatry

## 2015-01-23 ENCOUNTER — Encounter: Payer: Commercial Managed Care - HMO | Admitting: Podiatry

## 2015-01-29 ENCOUNTER — Encounter: Payer: Commercial Managed Care - HMO | Admitting: Podiatry

## 2015-02-06 NOTE — Progress Notes (Signed)
This encounter was created in error - please disregard.

## 2015-08-02 ENCOUNTER — Other Ambulatory Visit: Payer: Self-pay

## 2015-08-02 DIAGNOSIS — Z1231 Encounter for screening mammogram for malignant neoplasm of breast: Secondary | ICD-10-CM

## 2015-08-27 ENCOUNTER — Ambulatory Visit
Admission: RE | Admit: 2015-08-27 | Discharge: 2015-08-27 | Disposition: A | Discharge status: Home / Self Care | Payer: Commercial Managed Care - HMO | Source: Ambulatory Visit

## 2015-08-27 DIAGNOSIS — Z1231 Encounter for screening mammogram for malignant neoplasm of breast: Secondary | ICD-10-CM

## 2015-08-28 ENCOUNTER — Other Ambulatory Visit: Payer: Self-pay | Admitting: Family Medicine

## 2015-08-28 DIAGNOSIS — R928 Other abnormal and inconclusive findings on diagnostic imaging of breast: Secondary | ICD-10-CM

## 2015-09-04 ENCOUNTER — Ambulatory Visit
Admission: RE | Admit: 2015-09-04 | Discharge: 2015-09-04 | Disposition: A | Discharge status: Home / Self Care | Payer: Commercial Managed Care - HMO | Source: Ambulatory Visit | Attending: Family Medicine | Admitting: Family Medicine

## 2015-09-04 DIAGNOSIS — R928 Other abnormal and inconclusive findings on diagnostic imaging of breast: Secondary | ICD-10-CM

## 2016-02-29 ENCOUNTER — Encounter (HOSPITAL_BASED_OUTPATIENT_CLINIC_OR_DEPARTMENT_OTHER): Payer: Self-pay | Admitting: Emergency Medicine

## 2016-02-29 DIAGNOSIS — E119 Type 2 diabetes mellitus without complications: Secondary | ICD-10-CM | POA: Insufficient documentation

## 2016-02-29 DIAGNOSIS — L509 Urticaria, unspecified: Secondary | ICD-10-CM | POA: Diagnosis present

## 2016-02-29 DIAGNOSIS — L03114 Cellulitis of left upper limb: Secondary | ICD-10-CM | POA: Diagnosis not present

## 2016-02-29 NOTE — ED Triage Notes (Addendum)
Pt in with large area of erythema to L shoulder similar to hive onset today. Denies new meds, soaps, or detergents. Airway intact, no dyspnea. Alert, interactive, in NAD.

## 2016-03-01 ENCOUNTER — Emergency Department (HOSPITAL_BASED_OUTPATIENT_CLINIC_OR_DEPARTMENT_OTHER)
Admission: EM | Admit: 2016-03-01 | Discharge: 2016-03-01 | Disposition: A | Discharge status: Home / Self Care | Payer: Commercial Managed Care - HMO | Attending: Emergency Medicine | Admitting: Emergency Medicine

## 2016-03-01 DIAGNOSIS — L03114 Cellulitis of left upper limb: Secondary | ICD-10-CM

## 2016-03-01 MED ORDER — HYDROCORTISONE 1 % EX CREA
TOPICAL_CREAM | Freq: Once | CUTANEOUS | Status: AC
  Administered 2016-03-01: 1 via TOPICAL
  Filled 2016-03-01: qty 28

## 2016-03-01 MED ORDER — CEPHALEXIN 500 MG PO CAPS: 500.0000 mg | ORAL_CAPSULE | Freq: Two times a day (BID) | ORAL | 0 refills | Status: DC

## 2016-03-01 MED ORDER — DOXYCYCLINE HYCLATE 100 MG PO TABS
100.0000 mg | ORAL_TABLET | Freq: Once | ORAL | Status: AC
  Administered 2016-03-01: 100 mg via ORAL
  Filled 2016-03-01: qty 1

## 2016-03-01 MED ORDER — DOXYCYCLINE HYCLATE 100 MG PO CAPS: 100.0000 mg | ORAL_CAPSULE | Freq: Two times a day (BID) | ORAL | 0 refills | Status: DC

## 2016-03-01 MED ORDER — CEPHALEXIN 250 MG PO CAPS
500.0000 mg | ORAL_CAPSULE | Freq: Once | ORAL | Status: AC
  Administered 2016-03-01: 500 mg via ORAL
  Filled 2016-03-01: qty 2

## 2016-03-01 NOTE — ED Notes (Signed)
Pt given d/c instructions as per chart. Rx x 2. Verbalizes understanding. No questions. 

## 2016-03-01 NOTE — ED Notes (Signed)
Pt states she noticed a small red bump to right shoulder earlier today which is now larger and itches. Denies bite, injection or any other exposure to new substances.

## 2016-03-01 NOTE — ED Provider Notes (Signed)
[facility] Provider Note   CSN: [id number] Arrival date & time: 02/29/16  2227  By signing my name below, I, Julien Nordmann, attest that this documentation has been prepared under the direction and in the presence of Everlene Balls, MD.  Electronically Signed: Julien Nordmann, ED Scribe. 03/01/16. 12:42 AM.    History   Chief Complaint Chief Complaint  Patient presents with  . Urticaria   The history is provided by the patient. No language interpreter was used.   HPI Comments: Jill Cochran is a 45 y.o. female who presents to the Emergency Department complaining of sudden onset, gradual worsening, moderate urticaria and pain to the left shoulder. Pt has associated erythema, swelling, and itching to the area. She notes having the area start this morning and become progressively worse. Pt has not used any medications or topical treatments to alleviate her symptoms. She denies any new medications, soaps, or detergents. There are no other complaints or symptoms.  Past Medical History:  Diagnosis Date  . Chronic renal failure    Secondary to Right  Kidney  renal calculus and GERD  . Diabetes mellitus   . GERD (gastroesophageal reflux disease)   . Heart disease   . Venous insufficiency    at  [facility]    Patient Active Problem List   Diagnosis Date Noted  . Varicose veins of lower extremities with other complications [id number]    Past Surgical History:  Procedure Laterality Date  . KIDNEY SURGERY      OB History    No data available       Home Medications    Prior to Admission medications   Medication Sig Start Date End Date Taking? Authorizing Provider  indapamide (LOZOL) 1.25 MG tablet Take 1.25 mg by mouth daily.    Historical Provider, MD  omeprazole (PRILOSEC) 10 MG capsule Take 10 mg by mouth daily.    Historical Provider, MD    Family History Family History  Problem Relation Age of Onset  . Diabetes Mother   . Heart disease Mother   .  Diabetes Father   . Heart disease Father     Social History Social History  Substance Use Topics  . Smoking status: Never Smoker  . Smokeless tobacco: Never Used  . Alcohol use Yes     Allergies   Sulfa drugs cross reactors   Review of Systems Review of Systems  A complete 10 system review of systems was obtained and all systems are negative except as noted in the HPI and PMH.   Physical Exam Updated Vital Signs BP 135/84 (BP Location: Right Arm)   Pulse 72   Temp 98.2 F (36.8 C) (Oral)   Resp 20   Ht 5\' 11"  (1.803 m)   Wt 207 lb (93.9 kg)   SpO2 99%   BMI 28.87 kg/m   Physical Exam  Constitutional: She is oriented to person, place, and time. She appears well-developed and well-nourished. No distress.  HENT:  Head: Normocephalic and atraumatic.  Nose: Nose normal.  Mouth/Throat: Oropharynx is clear and moist. No oropharyngeal exudate.  Eyes: Conjunctivae and EOM are normal. Pupils are equal, round, and reactive to light. No scleral icterus.  Neck: Normal range of motion. Neck supple. No JVD present. No tracheal deviation present. No thyromegaly present.  Cardiovascular: Normal rate, regular rhythm and normal heart sounds.  Exam reveals no gallop and no friction rub.   No murmur heard. Pulmonary/Chest: Effort normal and breath sounds normal. No respiratory  distress. She has no wheezes. She exhibits no tenderness.  Abdominal: Soft. Bowel sounds are normal. She exhibits no distension and no mass. There is no tenderness. There is no rebound and no guarding.  Musculoskeletal: Normal range of motion. She exhibits no edema or tenderness.  Left anterior shoulder is erythematous, warm and TTP 4 cm in area circumferential   Lymphadenopathy:    She has no cervical adenopathy.  Neurological: She is alert and oriented to person, place, and time. No cranial nerve deficit. She exhibits normal muscle tone.  Skin: Skin is warm and dry. No rash noted. No erythema. No pallor.    Nursing note and vitals reviewed.    ED Treatments / Results  DIAGNOSTIC STUDIES: Oxygen Saturation is 100% on RA, normal by my interpretation.  COORDINATION OF CARE:  12:39 AM Discussed treatment plan with pt at bedside and pt agreed to plan.  Labs (all labs ordered are listed, but only abnormal results are displayed) Labs Reviewed - No data to display  EKG  EKG Interpretation None       Radiology No results found.  Procedures Procedures (including critical care time)  Medications Ordered in ED Medications  cephALEXin (KEFLEX) capsule 500 mg (500 mg Oral Given 03/01/16 0123)  doxycycline (VIBRA-TABS) tablet 100 mg (100 mg Oral Given 03/01/16 0117)  hydrocortisone cream 1 % (1 application Topical Given 03/01/16 0121)     Initial Impression / Assessment and Plan / ED Course  I have reviewed the triage vital signs and the nursing notes.  Pertinent labs & imaging results that were available during my care of the patient were reviewed by me and considered in my medical decision making (see chart for details).  Clinical Course    Patient presents to the emergency department for a lesion on her left shoulder. Physical exam is consistent with possible cellulitis. We'll treat with Keflex and doxycycline. Also hydrocortisone cream for any allergic component or pruritus. Primary care follow-up advised in 3 days for wound check. She appears well in no acute distress, vital signs were within her normal limits and she is safe for discharge.   I personally performed the services described in this documentation, which was scribed in my presence. The recorded information has been reviewed and is accurate.    Final Clinical Impressions(s) / ED Diagnoses   Final diagnoses:  None    New Prescriptions New Prescriptions   No medications on file     Everlene Balls, MD 03/01/16 0130

## 2016-10-22 DIAGNOSIS — Z01419 Encounter for gynecological examination (general) (routine) without abnormal findings: Secondary | ICD-10-CM | POA: Diagnosis not present

## 2016-10-22 DIAGNOSIS — Z124 Encounter for screening for malignant neoplasm of cervix: Secondary | ICD-10-CM | POA: Diagnosis not present

## 2016-11-03 DIAGNOSIS — L237 Allergic contact dermatitis due to plants, except food: Secondary | ICD-10-CM | POA: Diagnosis not present

## 2016-12-24 ENCOUNTER — Emergency Department (HOSPITAL_COMMUNITY)
Admission: EM | Admit: 2016-12-24 | Discharge: 2016-12-24 | Disposition: A | Discharge status: Home / Self Care | Payer: 59 | Attending: Emergency Medicine | Admitting: Emergency Medicine

## 2016-12-24 ENCOUNTER — Encounter (HOSPITAL_COMMUNITY): Payer: Self-pay | Admitting: Emergency Medicine

## 2016-12-24 DIAGNOSIS — R42 Dizziness and giddiness: Secondary | ICD-10-CM | POA: Insufficient documentation

## 2016-12-24 DIAGNOSIS — N189 Chronic kidney disease, unspecified: Secondary | ICD-10-CM | POA: Diagnosis not present

## 2016-12-24 DIAGNOSIS — Z79899 Other long term (current) drug therapy: Secondary | ICD-10-CM | POA: Insufficient documentation

## 2016-12-24 DIAGNOSIS — E119 Type 2 diabetes mellitus without complications: Secondary | ICD-10-CM | POA: Insufficient documentation

## 2016-12-24 LAB — CBC WITH DIFFERENTIAL/PLATELET
BASOS PCT: 0 %
Basophils Absolute: 0 10*3/uL (ref 0.0–0.1)
EOS ABS: 0 10*3/uL (ref 0.0–0.7)
Eosinophils Relative: 0 %
HEMATOCRIT: 42 % (ref 36.0–46.0)
HEMOGLOBIN: 14.4 g/dL (ref 12.0–15.0)
LYMPHS ABS: 1.5 10*3/uL (ref 0.7–4.0)
Lymphocytes Relative: 21 %
MCH: 29.6 pg (ref 26.0–34.0)
MCHC: 34.3 g/dL (ref 30.0–36.0)
MCV: 86.4 fL (ref 78.0–100.0)
Monocytes Absolute: 0.4 10*3/uL (ref 0.1–1.0)
Monocytes Relative: 5 %
NEUTROS ABS: 5 10*3/uL (ref 1.7–7.7)
NEUTROS PCT: 74 %
Platelets: 144 10*3/uL — ABNORMAL LOW (ref 150–400)
RBC: 4.86 MIL/uL (ref 3.87–5.11)
RDW: 12.6 % (ref 11.5–15.5)
WBC: 6.9 10*3/uL (ref 4.0–10.5)

## 2016-12-24 LAB — BASIC METABOLIC PANEL
Anion gap: 7 (ref 5–15)
BUN: 17 mg/dL (ref 6–20)
CHLORIDE: 106 mmol/L (ref 101–111)
CO2: 26 mmol/L (ref 22–32)
Calcium: 10.1 mg/dL (ref 8.9–10.3)
Creatinine, Ser: 1.09 mg/dL — ABNORMAL HIGH (ref 0.44–1.00)
GFR calc non Af Amer: 59 mL/min — ABNORMAL LOW (ref >60)
Glucose, Bld: 87 mg/dL (ref 65–99)
POTASSIUM: 3.9 mmol/L (ref 3.5–5.1)
SODIUM: 139 mmol/L (ref 135–145)

## 2016-12-24 LAB — POC URINE PREG, ED: PREG TEST UR: NEGATIVE

## 2016-12-24 NOTE — ED Triage Notes (Signed)
Pt sts felt "cold" while driving to work and had some trouble word finding; pt speaking clear at present; no obvious neuro deficits; pt denies pain of HA; EDP aware

## 2016-12-24 NOTE — ED Provider Notes (Signed)
[facility] DEPT Provider Note   CSN: [id number] Arrival date & time: 12/24/16  4098     History   Chief Complaint Chief Complaint  Patient presents with  . Dizziness    HPI Jill Cochran is a 46 y.o. female.  The history is provided by the patient.   Light headed, restlessness, and feeling cold inside for 3 hours. Constant. Unchanged. Also noted trouble with word finding. No difficulty with speech or focal weakness.   No CP, palpitations, SOB, N/V, abd pain. No recent fevers, chill, URI sx, V/D, hematochezia or melena.  On Phentermine for weightloss. On day 10 of 30.   Unlikely to be pregnant as she had endometrial ablation and has not been sexually active for 1 yr. Does not have menstrual cycles any longer due to the ablation.   Past Medical History:  Diagnosis Date  . Chronic renal failure    Secondary to Right  Kidney  renal calculus and GERD  . Diabetes mellitus   . GERD (gastroesophageal reflux disease)   . Heart disease   . Venous insufficiency    at  [facility]    Patient Active Problem List   Diagnosis Date Noted  . Varicose veins of lower extremities with other complications 11/91/4782    Past Surgical History:  Procedure Laterality Date  . KIDNEY SURGERY      OB History    No data available       Home Medications    Prior to Admission medications   Medication Sig Start Date End Date Taking? Authorizing Provider  indapamide (LOZOL) 1.25 MG tablet Take 1.25 mg by mouth daily.   Yes [provider]  phentermine 30 MG capsule Take 30 mg by mouth every other day. 10/29/16  Yes [provider]  omeprazole (PRILOSEC) 10 MG capsule Take 10 mg by mouth daily as needed (reflux).     [provider]    Family History Family History  Problem Relation Age of Onset  . Diabetes Mother   . Heart disease Mother   . Diabetes Father   . Heart disease Father     Social History Social History  Substance Use Topics    . Smoking status: Never Smoker  . Smokeless tobacco: Never Used  . Alcohol use Yes     Allergies   Ciprofloxacin and Sulfamethoxazole   Review of Systems Review of Systems   All other systems are reviewed and are negative for acute change except as noted in the HPI   Physical Exam Updated Vital Signs BP (!) 143/91   Pulse 66   Temp 98 F (36.7 C) (Oral)   Resp 20   Ht 5\' 8"  (1.727 m)   Wt 86.2 kg (190 lb)   SpO2 98%   BMI 28.89 kg/m   Physical Exam  Constitutional: She is oriented to person, place, and time. She appears well-developed and well-nourished. No distress.  HENT:  Head: Normocephalic and atraumatic.  Nose: Nose normal.  Eyes: Pupils are equal, round, and reactive to light. Conjunctivae and EOM are normal. Right eye exhibits no discharge. Left eye exhibits no discharge. No scleral icterus.  Neck: Normal range of motion. Neck supple.  Cardiovascular: Normal rate and regular rhythm.  Exam reveals no gallop and no friction rub.   No murmur heard. Pulmonary/Chest: Effort normal and breath sounds normal. No stridor. No respiratory distress. She has no rales.  Abdominal: Soft. She exhibits no distension. There is no tenderness.  Musculoskeletal: She exhibits  no edema or tenderness.  Neurological: She is alert and oriented to person, place, and time.  Mental Status: Alert and oriented to person, place, and time. Attention and concentration normal. Speech clear. Recent memory is intact  Cranial Nerves  II Visual Fields: Intact to confrontation. Visual fields intact. III, IV, VI: Pupils equal and reactive to light and near. Full eye movement without nystagmus  V Facial Sensation: Normal. No weakness of masticatory muscles  VII: No facial weakness or asymmetry  VIII Auditory Acuity: Grossly normal  IX/X: The uvula is midline; the palate elevates symmetrically  XI: Normal sternocleidomastoid and trapezius strength  XII: The tongue is midline. No atrophy or  fasciculations.   Motor System: Muscle Strength: 5/5 and symmetric in the upper and lower extremities. No pronation or drift.  Muscle Tone: Tone and muscle bulk are normal in the upper and lower extremities.   Reflexes: DTRs: 1+ and symmetrical in all four extremities. Plantar responses are flexor bilaterally.  Coordination: Intact finger-to-nose, heel-to-shin. No tremor.  Sensation: Intact to light touch, and pinprick.  Gait: Routine gait normal.    Skin: Skin is warm and dry. No rash noted. She is not diaphoretic. No erythema.  Psychiatric: She has a normal mood and affect.  Vitals reviewed.    ED Treatments / Results  Labs (all labs ordered are listed, but only abnormal results are displayed) Labs Reviewed  CBC WITH DIFFERENTIAL/PLATELET - Abnormal; Notable for the following:       Result Value   Platelets 144 (*)    All other components within normal limits  BASIC METABOLIC PANEL - Abnormal; Notable for the following:    Creatinine, Ser 1.09 (*)    GFR calc non Af Amer 59 (*)    All other components within normal limits  POC URINE PREG, ED    EKG  EKG Interpretation  Date/Time:  Thursday December 24 2016 09:56:41 EDT Ventricular Rate:  72 PR Interval:    QRS Duration: 91 QT Interval:  379 QTC Calculation: 415 R Axis:   91 Text Interpretation:  Sinus rhythm Borderline right axis deviation no stemi No old tracing to compare Confirmed by Addison Lank [phone]) on 12/24/2016 11:09:49 AM       Radiology No results found.  Procedures Procedures (including critical care time)  Medications Ordered in ED Medications - No data to display   Initial Impression / Assessment and Plan / ED Course  I have reviewed the triage vital signs and the nursing notes.  Pertinent labs & imaging results that were available during my care of the patient were reviewed by me and considered in my medical decision making (see chart for details).     EKG without acute ischemic changes,  dysrhythmias, or blocks, [place]. Doubt cardiac etiology of patient's symptomatology. No focal deficits or findings concerning for CVA/TIA. Likely secondary to medication side effect from phentermine. Screening labs without electrolyte derangements. UPT negative.  No evidence of emergent processes identified. The patient is safe for discharge with strict return precautions.   Final Clinical Impressions(s) / ED Diagnoses   Final diagnoses:  Lightheadedness   Disposition: Discharge  Condition: Good  I have discussed the results, Dx and Tx plan with the patient who expressed understanding and agree(s) with the plan. Discharge instructions discussed at great length. The patient was given strict return precautions who verbalized understanding of the instructions. No further questions at time of discharge.    New Prescriptions   No medications on file  Follow Up: Kathyrn Lass, MD [place] [id number] [phone]  Schedule an appointment as soon as possible for a visit  To discuss possible side effects from phentermine      Massiah Longanecker, Grayce Sessions, MD 12/24/16 1140

## 2017-02-10 DIAGNOSIS — R42 Dizziness and giddiness: Secondary | ICD-10-CM | POA: Diagnosis not present

## 2017-02-23 DIAGNOSIS — D485 Neoplasm of uncertain behavior of skin: Secondary | ICD-10-CM | POA: Diagnosis not present

## 2017-02-23 DIAGNOSIS — D225 Melanocytic nevi of trunk: Secondary | ICD-10-CM | POA: Diagnosis not present

## 2017-02-23 DIAGNOSIS — L820 Inflamed seborrheic keratosis: Secondary | ICD-10-CM | POA: Diagnosis not present

## 2017-05-04 DIAGNOSIS — R8290 Unspecified abnormal findings in urine: Secondary | ICD-10-CM | POA: Diagnosis not present

## 2017-05-04 DIAGNOSIS — R300 Dysuria: Secondary | ICD-10-CM | POA: Diagnosis not present

## 2017-07-21 DIAGNOSIS — J069 Acute upper respiratory infection, unspecified: Secondary | ICD-10-CM | POA: Diagnosis not present

## 2018-01-20 DIAGNOSIS — M79601 Pain in right arm: Secondary | ICD-10-CM | POA: Diagnosis not present

## 2018-01-25 DIAGNOSIS — R61 Generalized hyperhidrosis: Secondary | ICD-10-CM | POA: Diagnosis not present

## 2018-01-25 DIAGNOSIS — Z124 Encounter for screening for malignant neoplasm of cervix: Secondary | ICD-10-CM | POA: Diagnosis not present

## 2018-01-25 DIAGNOSIS — Z01419 Encounter for gynecological examination (general) (routine) without abnormal findings: Secondary | ICD-10-CM | POA: Diagnosis not present

## 2018-05-31 DIAGNOSIS — N200 Calculus of kidney: Secondary | ICD-10-CM | POA: Diagnosis not present

## 2018-05-31 DIAGNOSIS — E669 Obesity, unspecified: Secondary | ICD-10-CM | POA: Diagnosis not present

## 2018-05-31 DIAGNOSIS — N951 Menopausal and female climacteric states: Secondary | ICD-10-CM | POA: Diagnosis not present

## 2018-07-20 DIAGNOSIS — N951 Menopausal and female climacteric states: Secondary | ICD-10-CM | POA: Diagnosis not present

## 2018-07-20 DIAGNOSIS — E669 Obesity, unspecified: Secondary | ICD-10-CM | POA: Diagnosis not present

## 2019-06-09 DIAGNOSIS — G4730 Sleep apnea, unspecified: Secondary | ICD-10-CM

## 2019-06-09 HISTORY — DX: Sleep apnea, unspecified: G47.30

## 2019-08-07 ENCOUNTER — Other Ambulatory Visit (HOSPITAL_COMMUNITY): Payer: Self-pay | Admitting: Surgery

## 2019-08-07 ENCOUNTER — Other Ambulatory Visit: Payer: Self-pay | Admitting: Surgery

## 2019-08-09 ENCOUNTER — Encounter: Payer: 59 | Attending: Surgery | Admitting: Skilled Nursing Facility1

## 2019-08-09 ENCOUNTER — Encounter: Payer: Self-pay | Admitting: Skilled Nursing Facility1

## 2019-08-09 ENCOUNTER — Other Ambulatory Visit: Payer: Self-pay

## 2019-08-09 DIAGNOSIS — Z6837 Body mass index (BMI) 37.0-37.9, adult: Secondary | ICD-10-CM | POA: Insufficient documentation

## 2019-08-09 DIAGNOSIS — Z713 Dietary counseling and surveillance: Secondary | ICD-10-CM | POA: Diagnosis not present

## 2019-08-09 DIAGNOSIS — Z8249 Family history of ischemic heart disease and other diseases of the circulatory system: Secondary | ICD-10-CM | POA: Insufficient documentation

## 2019-08-09 DIAGNOSIS — G4730 Sleep apnea, unspecified: Secondary | ICD-10-CM | POA: Diagnosis not present

## 2019-08-09 DIAGNOSIS — Z833 Family history of diabetes mellitus: Secondary | ICD-10-CM | POA: Diagnosis not present

## 2019-08-09 DIAGNOSIS — I10 Essential (primary) hypertension: Secondary | ICD-10-CM | POA: Insufficient documentation

## 2019-08-09 DIAGNOSIS — Z87442 Personal history of urinary calculi: Secondary | ICD-10-CM | POA: Diagnosis not present

## 2019-08-09 DIAGNOSIS — K219 Gastro-esophageal reflux disease without esophagitis: Secondary | ICD-10-CM | POA: Insufficient documentation

## 2019-08-09 DIAGNOSIS — Z882 Allergy status to sulfonamides status: Secondary | ICD-10-CM | POA: Insufficient documentation

## 2019-08-09 DIAGNOSIS — Z79899 Other long term (current) drug therapy: Secondary | ICD-10-CM | POA: Insufficient documentation

## 2019-08-09 NOTE — Progress Notes (Signed)
Nutrition Assessment for Bariatric Surgery Medical Nutrition Therapy  Patient was seen on 08/09/2019 for Pre-Operative Nutrition Assessment. Letter of approval faxed to [facility] Surgery bariatric surgery program coordinator on 08/09/2019  Referral stated Supervised Weight Loss (SWL) visits needed: 0  Planned surgery: sleeve gastrectomy  Pt expectation of surgery: to lose weight Pt expectation of dietitian: to understand why needs certain foods    NUTRITION ASSESSMENT   Anthropometrics  Start weight at [facility]: 237 lbs (date: 08/09/2019)  Height: 67 in BMI: 37.14 kg/m2     Clinical  Medical hx: kidney stones Medications:  Labs:  Notable signs/symptoms:   Lifestyle & Dietary Hx   Pt states her son plays baseball so it is easier to buy food then making it at home. Dietitian educated pt on convenience options but healthy.   24-Hr Dietary Recall First Meal: coffee with half and half and rice cake Snack:  Second Meal: fats food or sandwich Snack:  Third Meal: skipped or bag of popcorn or frozen meal Snack:  Beverages: water   Estimated Energy Needs Calories: 1500 Carbohydrate: 170g Protein: 112g Fat: 42g   NUTRITION DIAGNOSIS  Overweight/obesity ([place]-3.3) related to past poor dietary habits and physical inactivity as evidenced by patient w/ planned sleeve gastrectomy  surgery following dietary guidelines for continued weight loss.    NUTRITION INTERVENTION  Nutrition counseling (C-1) and education (E-2) to facilitate bariatric surgery goals.   Pre-Op Goals Reviewed with the Patient . Track food and beverage intake (pen and paper, MyFitness Pal, Baritastic app, etc.) . Make healthy food choices while monitoring portion sizes . Consume 3 meals per day or try to eat every 3-5 hours . Avoid concentrated sugars and fried foods . Keep sugar & fat in the single digits per serving on food labels . Practice CHEWING your food (aim for applesauce consistency) . Practice  not drinking 15 minutes before, during, and 30 minutes after each meal and snack . Avoid all carbonated beverages (ex: soda, sparkling beverages)  . Limit caffeinated beverages (ex: coffee, tea, energy drinks) . Avoid all sugar-sweetened beverages (ex: regular soda, sports drinks)  . Avoid alcohol  . Aim for 64-100 ounces of FLUID daily (with at least half of fluid intake being plain water)  . Aim for at least 60-80 grams of PROTEIN daily . Look for a liquid protein source that contains ?15 g protein and ?5 g carbohydrate (ex: shakes, drinks, shots) . Make a list of non-food related activities . Physical activity is an important part of a healthy lifestyle so keep it moving! The goal is to reach 150 minutes of exercise per week, including cardiovascular and weight baring activity.  *Goals that are bolded indicate the pt would like to start working towards these  Handouts Provided Include  . Bariatric Surgery handouts (Nutrition Visits, Pre-Op Goals, Protein Shakes, Vitamins & Minerals)  Learning Style & Readiness for Change Teaching method utilized: Visual & Auditory  Demonstrated degree of understanding via: Teach Back  Barriers to learning/adherence to lifestyle change: none stated    MONITORING & EVALUATION Dietary intake, weekly physical activity, body weight, and pre-op goals reached at next nutrition visit.    Next Steps  Patient is to follow up at [facility] for Pre-Op Class >2 weeks before surgery for further nutrition education.

## 2019-08-14 ENCOUNTER — Ambulatory Visit (HOSPITAL_COMMUNITY)
Admission: RE | Admit: 2019-08-14 | Discharge: 2019-08-14 | Disposition: A | Discharge status: Home / Self Care | Payer: 59 | Source: Ambulatory Visit | Attending: Surgery | Admitting: Surgery

## 2019-08-14 ENCOUNTER — Other Ambulatory Visit: Payer: Self-pay

## 2019-09-27 ENCOUNTER — Ambulatory Visit (INDEPENDENT_AMBULATORY_CARE_PROVIDER_SITE_OTHER): Payer: 59 | Admitting: Psychology

## 2019-09-27 DIAGNOSIS — F509 Eating disorder, unspecified: Secondary | ICD-10-CM

## 2019-10-18 ENCOUNTER — Ambulatory Visit (INDEPENDENT_AMBULATORY_CARE_PROVIDER_SITE_OTHER): Payer: 59 | Admitting: Psychology

## 2019-10-18 DIAGNOSIS — F509 Eating disorder, unspecified: Secondary | ICD-10-CM

## 2019-12-25 ENCOUNTER — Other Ambulatory Visit: Payer: Self-pay

## 2019-12-25 ENCOUNTER — Encounter: Payer: 59 | Attending: Surgery | Admitting: Skilled Nursing Facility1

## 2019-12-25 DIAGNOSIS — E669 Obesity, unspecified: Secondary | ICD-10-CM

## 2019-12-25 NOTE — Progress Notes (Signed)
Pre-Operative Nutrition Class:  Appt start time: 8118   End time:  1830.  Patient was seen on 12/25/2019 for Pre-Operative Bariatric Surgery Education at [facility].    Surgery date:  Surgery type: sleeve Start weight at [facility]: 237 Weight today: 238.8   The following the learning objectives were met by the patient during this course:  Identify Pre-Op Dietary Goals and will begin 2 weeks pre-operatively  Identify appropriate sources of fluids and proteins   State protein recommendations and appropriate sources pre and post-operatively  Identify Post-Operative Dietary Goals and will follow for 2 weeks post-operatively  Identify appropriate multivitamin and calcium sources  Describe the need for physical activity post-operatively and will follow MD recommendations  State when to call healthcare provider regarding medication questions or post-operative complications  Handouts given during class include:  Pre-Op Bariatric Surgery Diet Handout  Protein Shake Handout  Post-Op Bariatric Surgery Nutrition Handout  BELT Program Information Flyer  Support Group Information Flyer  [facility] Bariatric Supplements Price List  Follow-Up Plan: Patient will follow-up at [facility] 2 weeks post operatively for diet advancement per MD.

## 2020-03-14 ENCOUNTER — Ambulatory Visit: Payer: Self-pay | Admitting: Surgery

## 2020-03-14 NOTE — Patient Instructions (Addendum)
DUE TO COVID-19 ONLY ONE VISITOR IS ALLOWED TO COME WITH YOU AND STAY IN THE WAITING ROOM ONLY DURING PRE OP AND PROCEDURE DAY OF SURGERY. THE 1 VISITOR  MAY VISIT WITH YOU AFTER SURGERY IN YOUR PRIVATE ROOM DURING VISITING HOURS ONLY!  YOU NEED TO HAVE A COVID 19 TEST ON__10/14_____ @_2 :45______, THIS TEST MUST BE DONE BEFORE SURGERY,  COVID TESTING SITE [place] [id number], IT IS ON THE RIGHT GOING OUT [address] APPROXIMATELY  2 MINUTES PAST [employer] ON THE RIGHT. ONCE YOUR COVID TEST IS COMPLETED,  PLEASE BEGIN THE QUARANTINE INSTRUCTIONS AS OUTLINED IN YOUR HANDOUT.                Jill Cochran    Your procedure is scheduled on: 10?18/21   Report to [place]  Entrance   Report to admitting at   11:00 AM     Call this number if you have problems the morning of surgery [phone]    Remember: Do not eat food After Midnight  . BRUSH YOUR TEETH MORNING OF SURGERY AND RINSE YOUR MOUTH OUT, NO CHEWING GUM CANDY OR MINTS.     NO SOLID FOOD AFTER 6:00 PM THE NIGHT BEFORE YOUR SURGERY.   YOU MAY DRINK CLEAR FLUIDS until 10:00 AM  THE G2 DRINK YOU DRINK BEFORE YOU LEAVE HOME WILL BE THE LAST FLUIDS YOU [place].  PAIN IS EXPECTED AFTER SURGERY AND WILL NOT BE COMPLETELY ELIMINATED.   AMBULATION AND TYLENOL WILL HELP REDUCE INCISIONAL AND GAS PAIN. MOVEMENT IS KEY!  YOU ARE EXPECTED TO BE OUT OF BED WITHIN 4 HOURS OF ADMISSION TO YOUR PATIENT ROOM.  SITTING IN THE RECLINER THROUGHOUT THE DAY IS IMPORTANT FOR DRINKING FLUIDS AND MOVING GAS THROUGHOUT THE GI TRACT.  COMPRESSION STOCKINGS SHOULD BE WORN [place] UNLESS YOU ARE WALKING.   INCENTIVE SPIROMETER SHOULD BE USED EVERY HOUR WHILE AWAKE TO DECREASE POST-OPERATIVE COMPLICATIONS SUCH AS PNEUMONIA.  WHEN DISCHARGED HOME, IT IS IMPORTANT TO CONTINUE TO WALK EVERY HOUR AND USE THE INCENTIVE SPIROMETER EVERY HOUR.   Take these medicines  the morning of surgery with A SIP OF WATER: Prozac, Omeprazole  Bring mask and tubing   to                           You may not have any metal on your body including hair pins and              piercings  Do not wear jewelry, make-up, lotions, powders or perfumes, deodorant             Do not wear nail polish on your fingernails.  Do not shave  48 hours prior to surgery.             Do not bring valuables to the hospital. [place].  Contacts, dentures or bridgework may not be worn into surgery.                 Please read over the following fact sheets you were given: _____________________________________________________________________  [facility] - Preparing for Surgery  Before surgery, you can play an important role.   Because skin is not sterile, your skin needs to be as free of germs as possible.   You can reduce  the number of germs on your skin by washing with CHG (chlorahexidine gluconate) soap before surgery.   CHG is an antiseptic cleaner which kills germs and bonds with the skin to continue killing germs even after washing. Please DO NOT use if you have an allergy to CHG or antibacterial soaps.   If your skin becomes reddened/irritated stop using the CHG and inform your nurse when you arrive at Short Stay. Do not shave (including legs and underarms) for at least 48 hours prior to the first CHG shower.    Please follow these instructions carefully:  1.  Shower with CHG Soap the night before surgery and the  morning of Surgery.  2.  If you choose to wash your hair, wash your hair first as usual with your  normal  shampoo.  3.  After you shampoo, rinse your hair and body thoroughly to remove the  shampoo.                                        4.  Use CHG as you would any other liquid soap.  You can apply chg directly  to the skin and wash                       Gently with a scrungie or clean washcloth.  5.  Apply the CHG  Soap to your body ONLY FROM THE NECK DOWN.   Do not use on face/ open                           Wound or open sores. Avoid contact with eyes, ears mouth and genitals (private parts).                       Wash face,  Genitals (private parts) with your normal soap.             6.  Wash thoroughly, paying special attention to the area where your surgery  will be performed.  7.  Thoroughly rinse your body with warm water from the neck down.  8.  DO NOT shower/wash with your normal soap after using and rinsing off  the CHG Soap.             9.  Pat yourself dry with a clean towel.            10.  Wear clean pajamas.            11.  Place clean sheets on your bed the night of your first shower and do not  sleep with pets. Day of Surgery : Do not apply any lotions/deodorants the morning of surgery.  Please wear clean clothes to the hospital/surgery center.  FAILURE TO FOLLOW THESE INSTRUCTIONS MAY RESULT IN THE CANCELLATION OF YOUR SURGERY PATIENT SIGNATURE_________________________________  NURSE SIGNATURE__________________________________  ________________________________________________________________________   Jill Cochran  An incentive spirometer is a tool that can help keep your lungs clear and active. This tool measures how well you are filling your lungs with each breath. Taking long deep breaths may help reverse or decrease the chance of developing breathing (pulmonary) problems (especially infection) following:  A long period of time when you are unable to move or be active. BEFORE THE PROCEDURE   If the spirometer includes an indicator to show your best effort, your  nurse or respiratory therapist will set it to a desired goal.  If possible, sit up straight or lean slightly forward. Try not to slouch.  Hold the incentive spirometer in an upright position. INSTRUCTIONS FOR USE  1. Sit on the edge of your bed if possible, or sit up as far as you can in bed or on a  chair. 2. Hold the incentive spirometer in an upright position. 3. Breathe out normally. 4. Place the mouthpiece in your mouth and seal your lips tightly around it. 5. Breathe in slowly and as deeply as possible, raising the piston or the ball toward the top of the column. 6. Hold your breath for 3-5 seconds or for as long as possible. Allow the piston or ball to fall to the bottom of the column. 7. Remove the mouthpiece from your mouth and breathe out normally. 8. Rest for a few seconds and repeat Steps 1 through 7 at least 10 times every 1-2 hours when you are awake. Take your time and take a few normal breaths between deep breaths. 9. The spirometer may include an indicator to show your best effort. Use the indicator as a goal to work toward during each repetition. 10. After each set of 10 deep breaths, practice coughing to be sure your lungs are clear. If you have an incision (the cut made at the time of surgery), support your incision when coughing by placing a pillow or rolled up towels firmly against it. Once you are able to get out of bed, walk around indoors and cough well. You may stop using the incentive spirometer when instructed by your caregiver.  RISKS AND COMPLICATIONS  Take your time so you do not get dizzy or light-headed.  If you are in pain, you may need to take or ask for pain medication before doing incentive spirometry. It is harder to take a deep breath if you are having pain. AFTER USE  Rest and breathe slowly and easily.  It can be helpful to keep track of a log of your progress. Your caregiver can provide you with a simple table to help with this. If you are using the spirometer at home, follow these instructions: [place] IF:   You are having difficultly using the spirometer.  You have trouble using the spirometer as often as instructed.  Your pain medication is not giving enough relief while using the spirometer.  You develop fever of 100.5 F  (38.1 C) or higher. SEEK IMMEDIATE MEDICAL CARE IF:   You cough up bloody sputum that had not been present before.  You develop fever of 102 F (38.9 C) or greater.  You develop worsening pain at or near the incision site. MAKE SURE YOU:   Understand these instructions.  Will watch your condition.  Will get help right away if you are not doing well or get worse. Document Released: 10/05/2006 Document Revised: 08/17/2011 Document Reviewed: 12/06/2006 [facility] Patient Information 2014 [place], [place].   ________________________________________________________________________

## 2020-03-15 ENCOUNTER — Encounter (HOSPITAL_COMMUNITY): Payer: Self-pay

## 2020-03-15 ENCOUNTER — Encounter (HOSPITAL_COMMUNITY)
Admission: RE | Admit: 2020-03-15 | Discharge: 2020-03-15 | Disposition: A | Discharge status: Home / Self Care | Payer: 59 | Source: Ambulatory Visit | Attending: Surgery | Admitting: Surgery

## 2020-03-15 ENCOUNTER — Other Ambulatory Visit: Payer: Self-pay

## 2020-03-15 DIAGNOSIS — Z01812 Encounter for preprocedural laboratory examination: Secondary | ICD-10-CM | POA: Diagnosis present

## 2020-03-15 HISTORY — DX: Peripheral vascular disease, unspecified: I73.9

## 2020-03-15 HISTORY — DX: Nausea with vomiting, unspecified: R11.2

## 2020-03-15 HISTORY — DX: Other complications of anesthesia, initial encounter: T88.59XA

## 2020-03-15 HISTORY — DX: Depression, unspecified: F32.A

## 2020-03-15 HISTORY — DX: Other specified postprocedural states: Z98.890

## 2020-03-15 HISTORY — DX: Personal history of urinary calculi: Z87.442

## 2020-03-15 LAB — COMPREHENSIVE METABOLIC PANEL
ALT: 19 U/L (ref 0–44)
AST: 19 U/L (ref 15–41)
Albumin: 3.9 g/dL (ref 3.5–5.0)
Alkaline Phosphatase: 66 U/L (ref 38–126)
Anion gap: 8 (ref 5–15)
BUN: 14 mg/dL (ref 6–20)
CO2: 27 mmol/L (ref 22–32)
Calcium: 9.2 mg/dL (ref 8.9–10.3)
Chloride: 104 mmol/L (ref 98–111)
Creatinine, Ser: 1.14 mg/dL — ABNORMAL HIGH (ref 0.44–1.00)
GFR, Estimated: 56 mL/min — ABNORMAL LOW (ref >60)
Glucose, Bld: 112 mg/dL — ABNORMAL HIGH (ref 70–99)
Potassium: 4 mmol/L (ref 3.5–5.1)
Sodium: 139 mmol/L (ref 135–145)
Total Bilirubin: 0.9 mg/dL (ref 0.3–1.2)
Total Protein: 7.1 g/dL (ref 6.5–8.1)

## 2020-03-15 LAB — CBC WITH DIFFERENTIAL/PLATELET
Abs Immature Granulocytes: 0.02 10*3/uL (ref 0.00–0.07)
Basophils Absolute: 0 10*3/uL (ref 0.0–0.1)
Basophils Relative: 1 %
Eosinophils Absolute: 0.1 10*3/uL (ref 0.0–0.5)
Eosinophils Relative: 2 %
HCT: 44.4 % (ref 36.0–46.0)
Hemoglobin: 14.4 g/dL (ref 12.0–15.0)
Immature Granulocytes: 0 %
Lymphocytes Relative: 23 %
Lymphs Abs: 1.3 10*3/uL (ref 0.7–4.0)
MCH: 29.1 pg (ref 26.0–34.0)
MCHC: 32.4 g/dL (ref 30.0–36.0)
MCV: 89.7 fL (ref 80.0–100.0)
Monocytes Absolute: 0.4 10*3/uL (ref 0.1–1.0)
Monocytes Relative: 7 %
Neutro Abs: 3.9 10*3/uL (ref 1.7–7.7)
Neutrophils Relative %: 67 %
Platelets: 159 10*3/uL (ref 150–400)
RBC: 4.95 MIL/uL (ref 3.87–5.11)
RDW: 12.5 % (ref 11.5–15.5)
WBC: 5.8 10*3/uL (ref 4.0–10.5)
nRBC: 0 % (ref 0.0–0.2)

## 2020-03-15 NOTE — Progress Notes (Signed)
COVID Vaccine Completed:Yes Date COVID Vaccine completed:10/03/19 COVID vaccine manufacturer:  Moderna     PCP - Dr. Garlon Hatchet Cardiologist -no   Chest x-ray - no EKG - 08/14/19 Stress Test - no ECHO - no Cardiac Cath - no Pacemaker/ICD device last checked:NA  Sleep Study - yes CPAP - yes  Fasting Blood Sugar - NA Checks Blood Sugar _____ times a day  Blood Thinner Instructions:NA Aspirin Instructions: Last Dose:  Anesthesia review:   Patient denies shortness of breath, fever, cough and chest pain at PAT appointment yes   Patient verbalized understanding of instructions that were given to them at the PAT appointment. Patient was also instructed that they will need to review over the PAT instructions again at home before surgery. Yes  Pt has just started using a C-Pap. She is able to climb 3 flights of stairs, do housework and ADLs without SOB.

## 2020-03-21 ENCOUNTER — Other Ambulatory Visit (HOSPITAL_COMMUNITY)
Admission: RE | Admit: 2020-03-21 | Discharge: 2020-03-21 | Disposition: A | Discharge status: Home / Self Care | Payer: 59 | Source: Ambulatory Visit | Attending: Surgery | Admitting: Surgery

## 2020-03-21 DIAGNOSIS — Z20822 Contact with and (suspected) exposure to covid-19: Secondary | ICD-10-CM | POA: Insufficient documentation

## 2020-03-21 DIAGNOSIS — Z01818 Encounter for other preprocedural examination: Secondary | ICD-10-CM | POA: Insufficient documentation

## 2020-03-21 LAB — SARS CORONAVIRUS 2 (TAT 6-24 HRS): SARS Coronavirus 2: NEGATIVE

## 2020-03-24 MED ORDER — BUPIVACAINE LIPOSOME 1.3 % IJ SUSP
20.0000 mL | Freq: Once | INTRAMUSCULAR | Status: DC
  Filled 2020-03-24: qty 20

## 2020-03-24 NOTE — H&P (Signed)
Chief Complaint:  obesity  History of Present Illness:  Jill Cochran is an 49 y.o. female who presents for sleeve gastrectomy  Past Medical History:  Diagnosis Date  . Chronic renal failure    Secondary to Right  Kidney  renal calculus and GERD  . Complication of anesthesia   . Depression   . GERD (gastroesophageal reflux disease)   . History of kidney stones   . Peripheral vascular disease ([place])    leaky vein in Rt leg. is now resolved  . PONV (postoperative nausea and vomiting)   . Sleep apnea 2021  . Venous insufficiency    at  [facility]    Past Surgical History:  Procedure Laterality Date  . KIDNEY SURGERY Right 2008   percutaneous nephrolithotomies  . TONSILLECTOMY     as a child    Current Facility-Administered Medications  Medication Dose Route Frequency Provider Last Rate Last Admin  . [START ON 03/25/2020] bupivacaine liposome (EXPAREL) 1.3 % injection 266 mg  20 mL Infiltration Once Johnathan Hausen, MD       Current Outpatient Medications  Medication Sig Dispense Refill  . ALPRAZolam (XANAX) 0.25 MG tablet Take 0.25 mg by mouth daily as needed for anxiety.     . Cholecalciferol (VITAMIN D) 50 MCG (2000 UT) CAPS Take 2,000 Units by mouth daily.    Marland Kitchen FLUoxetine (PROZAC) 20 MG capsule Take 20 mg by mouth daily.    . indapamide (LOZOL) 2.5 MG tablet Take 2.5 mg by mouth daily.     . Multiple Vitamin (MULTIVITAMIN WITH MINERALS) TABS tablet Take 1 tablet by mouth daily.    Marland Kitchen tretinoin (RETIN-A) 0.025 % cream Apply 1 application topically at bedtime.    Marland Kitchen omeprazole (PRILOSEC) 10 MG capsule Take 10 mg by mouth daily as needed (reflux).  (Patient not taking: Reported on 03/14/2020)    . phentermine 30 MG capsule Take 30 mg by mouth every other day. (Patient not taking: Reported on 03/14/2020)  0   Ciprofloxacin and Sulfamethoxazole Family History  Problem Relation Age of Onset  . Diabetes Mother   . Heart disease Mother   . Diabetes Father   . Heart disease  Father    Social History:   reports that she has never smoked. She has never used smokeless tobacco. She reports current alcohol use of about 2.0 standard drinks of alcohol per week. She reports that she does not use drugs.   REVIEW OF SYSTEMS : Negative except for see problem list  Physical Exam:   Last menstrual period 06/08/2010. There is no height or weight on file to calculate BMI.  Gen:  WDWN WF NAD  Neurological: Alert and oriented to person, place, and time. Motor and sensory function is grossly intact  Head: Normocephalic and atraumatic.  Eyes: Conjunctivae are normal. Pupils are equal, round, and reactive to light. No scleral icterus.  Neck: Normal range of motion. Neck supple. No tracheal deviation or thyromegaly present.  Cardiovascular:  SR without murmurs or gallops.  No carotid bruits Breast:  Not examined Respiratory: Effort normal.  No respiratory distress. No chest wall tenderness. Breath sounds normal.  No wheezes, rales or rhonchi.  Abdomen:  nontender GU:  Not examined Musculoskeletal: Normal range of motion. Extremities are nontender. No cyanosis, edema or clubbing noted Lymphadenopathy: No cervical, preauricular, postauricular or axillary adenopathy is present Skin: Skin is warm and dry. No rash noted. No diaphoresis. No erythema. No pallor. Pscyh: Normal mood and affect. Behavior is normal. Judgment  and thought content normal.   LABORATORY RESULTS: No results found for this or any previous visit (from the past 48 hour(s)).   RADIOLOGY RESULTS: No results found.  Problem List: Patient Active Problem List   Diagnosis Date Noted  . Varicose veins of lower extremities with other complications 45/84/8350    Assessment & Plan: Ms Jill Cochran has had obesity her adult life and came forward desiring a sleeve gastrectomy.  She has obstructive sleep apnea diagnosed and treated by Dr. Orlie Pollen.  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  She has had multiple percutaneous nephrostomies at [facility] urology and at [facility].  She is approved for sleeve gastrectomy and after discussing the procedure and getting informed consent she presents for surgery today.    Matt B. Hassell Done, MD, [facility] [phone] beeper [phone]  03/24/2020 10:59 AM

## 2020-03-25 ENCOUNTER — Encounter (HOSPITAL_COMMUNITY): Payer: Self-pay | Admitting: Surgery

## 2020-03-25 ENCOUNTER — Encounter (HOSPITAL_COMMUNITY)
Admission: RE | Disposition: A | Discharge status: Home / Self Care | Payer: Self-pay | Source: Home / Self Care | Attending: Surgery

## 2020-03-25 ENCOUNTER — Inpatient Hospital Stay (HOSPITAL_COMMUNITY)
Admission: RE | Admit: 2020-03-25 | Discharge: 2020-03-27 | DRG: 621 | Disposition: A | Discharge status: Home / Self Care | Payer: 59 | Attending: Surgery | Admitting: Surgery

## 2020-03-25 ENCOUNTER — Inpatient Hospital Stay (HOSPITAL_COMMUNITY): Payer: 59 | Admitting: Anesthesiology

## 2020-03-25 DIAGNOSIS — F32A Depression, unspecified: Secondary | ICD-10-CM | POA: Diagnosis present

## 2020-03-25 DIAGNOSIS — N183 Chronic kidney disease, stage 3 unspecified: Secondary | ICD-10-CM | POA: Diagnosis present

## 2020-03-25 DIAGNOSIS — K449 Diaphragmatic hernia without obstruction or gangrene: Secondary | ICD-10-CM | POA: Diagnosis present

## 2020-03-25 DIAGNOSIS — K219 Gastro-esophageal reflux disease without esophagitis: Secondary | ICD-10-CM | POA: Diagnosis present

## 2020-03-25 DIAGNOSIS — I739 Peripheral vascular disease, unspecified: Secondary | ICD-10-CM | POA: Diagnosis present

## 2020-03-25 DIAGNOSIS — N200 Calculus of kidney: Secondary | ICD-10-CM | POA: Diagnosis present

## 2020-03-25 DIAGNOSIS — Z79899 Other long term (current) drug therapy: Secondary | ICD-10-CM

## 2020-03-25 DIAGNOSIS — I129 Hypertensive chronic kidney disease with stage 1 through stage 4 chronic kidney disease, or unspecified chronic kidney disease: Secondary | ICD-10-CM | POA: Diagnosis present

## 2020-03-25 DIAGNOSIS — Z8249 Family history of ischemic heart disease and other diseases of the circulatory system: Secondary | ICD-10-CM | POA: Diagnosis not present

## 2020-03-25 DIAGNOSIS — Z6836 Body mass index (BMI) 36.0-36.9, adult: Secondary | ICD-10-CM | POA: Diagnosis not present

## 2020-03-25 DIAGNOSIS — Z9884 Bariatric surgery status: Secondary | ICD-10-CM

## 2020-03-25 DIAGNOSIS — G4733 Obstructive sleep apnea (adult) (pediatric): Secondary | ICD-10-CM

## 2020-03-25 DIAGNOSIS — Z87442 Personal history of urinary calculi: Secondary | ICD-10-CM | POA: Diagnosis not present

## 2020-03-25 HISTORY — PX: LAPAROSCOPIC GASTRIC SLEEVE RESECTION: SHX5895

## 2020-03-25 HISTORY — PX: UPPER GI ENDOSCOPY: SHX6162

## 2020-03-25 LAB — CBC
HCT: 42.7 % (ref 36.0–46.0)
Hemoglobin: 14.1 g/dL (ref 12.0–15.0)
MCH: 28.8 pg (ref 26.0–34.0)
MCHC: 33 g/dL (ref 30.0–36.0)
MCV: 87.1 fL (ref 80.0–100.0)
Platelets: 157 10*3/uL (ref 150–400)
RBC: 4.9 MIL/uL (ref 3.87–5.11)
RDW: 12.4 % (ref 11.5–15.5)
WBC: 6.6 10*3/uL (ref 4.0–10.5)
nRBC: 0 % (ref 0.0–0.2)

## 2020-03-25 LAB — TYPE AND SCREEN
ABO/RH(D): O POS
Antibody Screen: NEGATIVE

## 2020-03-25 LAB — CREATININE, SERUM
Creatinine, Ser: 1.08 mg/dL — ABNORMAL HIGH (ref 0.44–1.00)
GFR, Estimated: 59 mL/min — ABNORMAL LOW (ref >60)

## 2020-03-25 LAB — HEMOGLOBIN AND HEMATOCRIT, BLOOD
HCT: 43.2 % (ref 36.0–46.0)
Hemoglobin: 14.3 g/dL (ref 12.0–15.0)

## 2020-03-25 LAB — ABO/RH: ABO/RH(D): O POS

## 2020-03-25 SURGERY — GASTRECTOMY, SLEEVE, LAPAROSCOPIC
Anesthesia: General

## 2020-03-25 MED ORDER — KCL IN DEXTROSE-NACL 20-5-0.45 MEQ/L-%-% IV SOLN
INTRAVENOUS | Status: DC
  Filled 2020-03-25 (×5): qty 1000

## 2020-03-25 MED ORDER — PROPOFOL 10 MG/ML IV BOLUS
INTRAVENOUS | Status: AC
  Filled 2020-03-25: qty 20

## 2020-03-25 MED ORDER — HEPARIN SODIUM (PORCINE) 5000 UNIT/ML IJ SOLN
5000.0000 [IU] | INTRAMUSCULAR | Status: AC
  Administered 2020-03-25: 5000 [IU] via SUBCUTANEOUS
  Filled 2020-03-25: qty 1

## 2020-03-25 MED ORDER — EPHEDRINE SULFATE-NACL 50-0.9 MG/10ML-% IV SOSY
PREFILLED_SYRINGE | INTRAVENOUS | Status: DC | PRN
  Administered 2020-03-25 (×2): 10 mg via INTRAVENOUS

## 2020-03-25 MED ORDER — LABETALOL HCL 5 MG/ML IV SOLN
10.0000 mg | Freq: Once | INTRAVENOUS | Status: AC
  Administered 2020-03-25: 10 mg via INTRAVENOUS

## 2020-03-25 MED ORDER — SODIUM CHLORIDE (PF) 0.9 % IJ SOLN
INTRAMUSCULAR | Status: AC
  Filled 2020-03-25: qty 10

## 2020-03-25 MED ORDER — LIDOCAINE 2% (20 MG/ML) 5 ML SYRINGE
INTRAMUSCULAR | Status: DC | PRN
  Administered 2020-03-25: 60 mg via INTRAVENOUS

## 2020-03-25 MED ORDER — FENTANYL CITRATE (PF) 100 MCG/2ML IJ SOLN
INTRAMUSCULAR | Status: DC | PRN
  Administered 2020-03-25 (×2): 50 ug via INTRAVENOUS
  Administered 2020-03-25: 100 ug via INTRAVENOUS

## 2020-03-25 MED ORDER — DEXAMETHASONE SODIUM PHOSPHATE 10 MG/ML IJ SOLN
INTRAMUSCULAR | Status: AC
  Filled 2020-03-25: qty 1

## 2020-03-25 MED ORDER — APREPITANT 40 MG PO CAPS
40.0000 mg | ORAL_CAPSULE | ORAL | Status: AC
  Administered 2020-03-25: 40 mg via ORAL
  Filled 2020-03-25: qty 1

## 2020-03-25 MED ORDER — LACTATED RINGERS IV SOLN: INTRAVENOUS | Status: DC

## 2020-03-25 MED ORDER — SODIUM CHLORIDE 0.9 % IV SOLN
2.0000 g | INTRAVENOUS | Status: AC
  Administered 2020-03-25: 2 g via INTRAVENOUS
  Filled 2020-03-25: qty 2

## 2020-03-25 MED ORDER — ONDANSETRON HCL 4 MG/2ML IJ SOLN
INTRAMUSCULAR | Status: DC | PRN
  Administered 2020-03-25: 4 mg via INTRAVENOUS

## 2020-03-25 MED ORDER — ACETAMINOPHEN 160 MG/5ML PO SOLN
1000.0000 mg | Freq: Three times a day (TID) | ORAL | Status: DC
  Administered 2020-03-26: 1000 mg via ORAL
  Filled 2020-03-25 (×2): qty 40.6

## 2020-03-25 MED ORDER — PHENYLEPHRINE 40 MCG/ML (10ML) SYRINGE FOR IV PUSH (FOR BLOOD PRESSURE SUPPORT)
PREFILLED_SYRINGE | INTRAVENOUS | Status: DC | PRN
  Administered 2020-03-25: 80 ug via INTRAVENOUS
  Administered 2020-03-25: 120 ug via INTRAVENOUS
  Administered 2020-03-25: 80 ug via INTRAVENOUS
  Administered 2020-03-25: 120 ug via INTRAVENOUS

## 2020-03-25 MED ORDER — DEXAMETHASONE SODIUM PHOSPHATE 4 MG/ML IJ SOLN
INTRAMUSCULAR | Status: DC | PRN
  Administered 2020-03-25: 4 mg via INTRAVENOUS

## 2020-03-25 MED ORDER — GABAPENTIN 300 MG PO CAPS
300.0000 mg | ORAL_CAPSULE | ORAL | Status: AC
  Administered 2020-03-25: 300 mg via ORAL
  Filled 2020-03-25: qty 1

## 2020-03-25 MED ORDER — PROMETHAZINE HCL 25 MG/ML IJ SOLN
6.2500 mg | INTRAMUSCULAR | Status: DC | PRN
  Administered 2020-03-25: 6.25 mg via INTRAVENOUS

## 2020-03-25 MED ORDER — SODIUM CHLORIDE (PF) 0.9 % IJ SOLN
INTRAMUSCULAR | Status: DC | PRN
  Administered 2020-03-25: 10 mL

## 2020-03-25 MED ORDER — MIDAZOLAM HCL 5 MG/5ML IJ SOLN
INTRAMUSCULAR | Status: DC | PRN
  Administered 2020-03-25: 2 mg via INTRAVENOUS

## 2020-03-25 MED ORDER — PHENYLEPHRINE 40 MCG/ML (10ML) SYRINGE FOR IV PUSH (FOR BLOOD PRESSURE SUPPORT)
PREFILLED_SYRINGE | INTRAVENOUS | Status: AC
  Filled 2020-03-25: qty 10

## 2020-03-25 MED ORDER — ENSURE MAX PROTEIN PO LIQD
2.0000 [oz_av] | ORAL | Status: DC
  Administered 2020-03-26 – 2020-03-27 (×3): 2 [oz_av] via ORAL

## 2020-03-25 MED ORDER — MIDAZOLAM HCL 2 MG/2ML IJ SOLN
INTRAMUSCULAR | Status: AC
  Filled 2020-03-25: qty 2

## 2020-03-25 MED ORDER — ACETAMINOPHEN 500 MG PO TABS
1000.0000 mg | ORAL_TABLET | Freq: Three times a day (TID) | ORAL | Status: DC
  Administered 2020-03-26 – 2020-03-27 (×2): 1000 mg via ORAL
  Filled 2020-03-25 (×4): qty 2

## 2020-03-25 MED ORDER — CHLORHEXIDINE GLUCONATE 0.12 % MT SOLN
15.0000 mL | Freq: Once | OROMUCOSAL | Status: AC
  Administered 2020-03-25: 15 mL via OROMUCOSAL

## 2020-03-25 MED ORDER — ONDANSETRON HCL 4 MG/2ML IJ SOLN
INTRAMUSCULAR | Status: AC
  Filled 2020-03-25: qty 2

## 2020-03-25 MED ORDER — PROMETHAZINE HCL 25 MG/ML IJ SOLN
INTRAMUSCULAR | Status: AC
  Filled 2020-03-25: qty 1

## 2020-03-25 MED ORDER — ORAL CARE MOUTH RINSE: 15.0000 mL | Freq: Once | OROMUCOSAL | Status: AC

## 2020-03-25 MED ORDER — LABETALOL HCL 5 MG/ML IV SOLN
INTRAVENOUS | Status: AC
  Filled 2020-03-25: qty 4

## 2020-03-25 MED ORDER — FENTANYL CITRATE (PF) 250 MCG/5ML IJ SOLN
INTRAMUSCULAR | Status: AC
Start: 2020-03-25 — End: ?
  Filled 2020-03-25: qty 5

## 2020-03-25 MED ORDER — OXYCODONE HCL 5 MG/5ML PO SOLN: 5.0000 mg | Freq: Four times a day (QID) | ORAL | Status: DC | PRN

## 2020-03-25 MED ORDER — ACETAMINOPHEN 500 MG PO TABS
1000.0000 mg | ORAL_TABLET | ORAL | Status: AC
  Administered 2020-03-25: 1000 mg via ORAL
  Filled 2020-03-25: qty 2

## 2020-03-25 MED ORDER — FENTANYL CITRATE (PF) 100 MCG/2ML IJ SOLN
INTRAMUSCULAR | Status: AC
  Filled 2020-03-25: qty 2

## 2020-03-25 MED ORDER — 0.9 % SODIUM CHLORIDE (POUR BTL) OPTIME
TOPICAL | Status: DC | PRN
  Administered 2020-03-25: 1000 mL

## 2020-03-25 MED ORDER — PROPOFOL 10 MG/ML IV BOLUS
INTRAVENOUS | Status: DC | PRN
  Administered 2020-03-25: 150 mg via INTRAVENOUS

## 2020-03-25 MED ORDER — CHLORHEXIDINE GLUCONATE CLOTH 2 % EX PADS: 6.0000 | MEDICATED_PAD | Freq: Once | CUTANEOUS | Status: DC

## 2020-03-25 MED ORDER — SCOPOLAMINE 1 MG/3DAYS TD PT72
1.0000 | MEDICATED_PATCH | TRANSDERMAL | Status: DC
  Administered 2020-03-25: 1.5 mg via TRANSDERMAL
  Filled 2020-03-25: qty 1

## 2020-03-25 MED ORDER — BUPIVACAINE LIPOSOME 1.3 % IJ SUSP
INTRAMUSCULAR | Status: DC | PRN
  Administered 2020-03-25: 20 mL

## 2020-03-25 MED ORDER — LIDOCAINE 2% (20 MG/ML) 5 ML SYRINGE
INTRAMUSCULAR | Status: AC
  Filled 2020-03-25: qty 5

## 2020-03-25 MED ORDER — MORPHINE SULFATE (PF) 4 MG/ML IV SOLN
1.0000 mg | INTRAVENOUS | Status: DC | PRN
  Administered 2020-03-25 – 2020-03-26 (×4): 2 mg via INTRAVENOUS
  Filled 2020-03-25 (×4): qty 1

## 2020-03-25 MED ORDER — FENTANYL CITRATE (PF) 100 MCG/2ML IJ SOLN
25.0000 ug | INTRAMUSCULAR | Status: DC | PRN
  Administered 2020-03-25 (×2): 25 ug via INTRAVENOUS

## 2020-03-25 MED ORDER — HEPARIN SODIUM (PORCINE) 5000 UNIT/ML IJ SOLN
5000.0000 [IU] | Freq: Three times a day (TID) | INTRAMUSCULAR | Status: DC
  Administered 2020-03-25 – 2020-03-27 (×6): 5000 [IU] via SUBCUTANEOUS
  Filled 2020-03-25 (×6): qty 1

## 2020-03-25 MED ORDER — ONDANSETRON HCL 4 MG/2ML IJ SOLN
4.0000 mg | INTRAMUSCULAR | Status: DC | PRN
  Administered 2020-03-26 – 2020-03-27 (×5): 4 mg via INTRAVENOUS
  Filled 2020-03-25 (×5): qty 2

## 2020-03-25 MED ORDER — EPHEDRINE 5 MG/ML INJ
INTRAVENOUS | Status: AC
  Filled 2020-03-25: qty 10

## 2020-03-25 MED ORDER — SUGAMMADEX SODIUM 200 MG/2ML IV SOLN
INTRAVENOUS | Status: DC | PRN
  Administered 2020-03-25: 200 mg via INTRAVENOUS

## 2020-03-25 MED ORDER — PANTOPRAZOLE SODIUM 40 MG IV SOLR
40.0000 mg | Freq: Every day | INTRAVENOUS | Status: DC
  Administered 2020-03-25 – 2020-03-26 (×2): 40 mg via INTRAVENOUS
  Filled 2020-03-25 (×2): qty 40

## 2020-03-25 MED ORDER — LACTATED RINGERS IR SOLN
Status: DC | PRN
  Administered 2020-03-25: 1000 mL

## 2020-03-25 MED ORDER — ROCURONIUM BROMIDE 10 MG/ML (PF) SYRINGE
PREFILLED_SYRINGE | INTRAVENOUS | Status: DC | PRN
  Administered 2020-03-25: 60 mg via INTRAVENOUS
  Administered 2020-03-25: 20 mg via INTRAVENOUS

## 2020-03-25 MED ORDER — ROCURONIUM BROMIDE 10 MG/ML (PF) SYRINGE
PREFILLED_SYRINGE | INTRAVENOUS | Status: AC
  Filled 2020-03-25: qty 10

## 2020-03-25 SURGICAL SUPPLY — 63 items
APPLICATOR COTTON TIP 6 STRL (MISCELLANEOUS) ×1 IMPLANT
APPLICATOR COTTON TIP 6IN STRL (MISCELLANEOUS) ×3
APPLIER CLIP 5 13 M/L LIGAMAX5 (MISCELLANEOUS)
APPLIER CLIP ROT 10 11.4 M/L (STAPLE)
APPLIER CLIP ROT 13.4 12 LRG (CLIP)
BLADE SURG 15 STRL LF DISP TIS (BLADE) ×1 IMPLANT
BLADE SURG 15 STRL SS (BLADE) ×3
CABLE HIGH FREQUENCY MONO STRZ (ELECTRODE) ×3 IMPLANT
CLIP APPLIE 5 13 M/L LIGAMAX5 (MISCELLANEOUS) IMPLANT
CLIP APPLIE ROT 10 11.4 M/L (STAPLE) IMPLANT
CLIP APPLIE ROT 13.4 12 LRG (CLIP) IMPLANT
COVER WAND RF STERILE (DRAPES) IMPLANT
DECANTER SPIKE VIAL GLASS SM (MISCELLANEOUS) ×3 IMPLANT
DERMABOND ADVANCED (GAUZE/BANDAGES/DRESSINGS) ×2
DERMABOND ADVANCED .7 DNX12 (GAUZE/BANDAGES/DRESSINGS) ×1 IMPLANT
DEVICE SUT QUICK LOAD TK 5 (STAPLE) ×4 IMPLANT
DEVICE SUT TI-KNOT TK 5X26 (MISCELLANEOUS) ×2 IMPLANT
DEVICE SUTURE ENDOST 10MM (ENDOMECHANICALS) ×3 IMPLANT
DEVICE TI KNOT TK5 (MISCELLANEOUS) ×1
DISSECTOR BLUNT TIP ENDO 5MM (MISCELLANEOUS) ×3 IMPLANT
ELECT REM PT RETURN 15FT ADLT (MISCELLANEOUS) ×3 IMPLANT
GAUZE SPONGE 4X4 12PLY STRL (GAUZE/BANDAGES/DRESSINGS) IMPLANT
GLOVE BIOGEL M 8.0 STRL (GLOVE) ×3 IMPLANT
GOWN STRL REUS W/TWL XL LVL3 (GOWN DISPOSABLE) ×12 IMPLANT
GRASPER SUT TROCAR 14GX15 (MISCELLANEOUS) ×3 IMPLANT
HANDLE STAPLE EGIA 4 XL (STAPLE) ×3 IMPLANT
KIT BASIN OR (CUSTOM PROCEDURE TRAY) ×3 IMPLANT
KIT TURNOVER KIT A (KITS) ×3 IMPLANT
MARKER SKIN DUAL TIP RULER LAB (MISCELLANEOUS) ×3 IMPLANT
MAT PREVALON FULL STRYKER (MISCELLANEOUS) ×3 IMPLANT
NEEDLE SPNL 22GX3.5 QUINCKE BK (NEEDLE) ×3 IMPLANT
PACK CARDIOVASCULAR III (CUSTOM PROCEDURE TRAY) ×3 IMPLANT
QUICK LOAD TK 5 (STAPLE) ×2
RELOAD TRI 45 ART MED THCK BLK (STAPLE) ×3 IMPLANT
RELOAD TRI 45 ART MED THCK PUR (STAPLE) IMPLANT
RELOAD TRI 60 ART MED THCK BLK (STAPLE) ×3 IMPLANT
RELOAD TRI 60 ART MED THCK PUR (STAPLE) ×9 IMPLANT
SCISSORS LAP 5X45 EPIX DISP (ENDOMECHANICALS) IMPLANT
SET IRRIG TUBING LAPAROSCOPIC (IRRIGATION / IRRIGATOR) ×3 IMPLANT
SET TUBE SMOKE EVAC HIGH FLOW (TUBING) ×3 IMPLANT
SHEARS HARMONIC ACE PLUS 45CM (MISCELLANEOUS) ×3 IMPLANT
SLEEVE ADV FIXATION 5X100MM (TROCAR) ×6 IMPLANT
SLEEVE GASTRECTOMY 36FR VISIGI (MISCELLANEOUS) ×3 IMPLANT
SOL ANTI FOG 6CC (MISCELLANEOUS) ×1 IMPLANT
SOLUTION ANTI FOG 6CC (MISCELLANEOUS) ×2
SPONGE LAP 18X18 RF (DISPOSABLE) ×3 IMPLANT
STAPLER VISISTAT 35W (STAPLE) IMPLANT
SUT MNCRL AB 4-0 PS2 18 (SUTURE) ×6 IMPLANT
SUT SURGIDAC NAB ES-9 0 48 120 (SUTURE) ×6 IMPLANT
SUT VICRYL 0 TIES 12 18 (SUTURE) ×3 IMPLANT
SYR 10ML ECCENTRIC (SYRINGE) ×3 IMPLANT
SYR 20ML LL LF (SYRINGE) ×3 IMPLANT
SYR 50ML LL SCALE MARK (SYRINGE) ×3 IMPLANT
TOWEL OR 17X26 10 PK STRL BLUE (TOWEL DISPOSABLE) ×3 IMPLANT
TOWEL OR NON WOVEN STRL DISP B (DISPOSABLE) ×3 IMPLANT
TRAY FOLEY MTR SLVR 16FR STAT (SET/KITS/TRAYS/PACK) IMPLANT
TROCAR ADV FIXATION 5X100MM (TROCAR) ×3 IMPLANT
TROCAR BLADELESS 15MM (ENDOMECHANICALS) ×3 IMPLANT
TROCAR BLADELESS OPT 5 100 (ENDOMECHANICALS) ×3 IMPLANT
TUBE CALIBRATION LAPBAND (TUBING) IMPLANT
TUBING CONNECTING 10 (TUBING) ×4 IMPLANT
TUBING CONNECTING 10' (TUBING) ×2
TUBING ENDO SMARTCAP (MISCELLANEOUS) ×3 IMPLANT

## 2020-03-25 NOTE — Transfer of Care (Signed)
Immediate Anesthesia Transfer of Care Note  Patient: Jill Cochran  Procedure(s) Performed: LAPAROSCOPIC GASTRIC SLEEVE RESECTION WITH HIATAL HERNIA REPAIR (N/A ) UPPER GI ENDOSCOPY (N/A )  Patient Location: PACU  Anesthesia Type:General  Level of Consciousness: drowsy, patient cooperative and responds to stimulation  Airway & Oxygen Therapy: Patient Spontanous Breathing and Patient connected to face mask oxygen  Post-op Assessment: Report given to RN, Post -op Vital signs reviewed and stable and Patient moving all extremities  Post vital signs: Reviewed and stable  Last Vitals:  Vitals Value Taken Time  BP 164/106 03/25/20 1508  Temp 36.3 C 03/25/20 1508  Pulse 95 03/25/20 1513  Resp 17 03/25/20 1513  SpO2 100 % 03/25/20 1513  Vitals shown include unvalidated device data.  Last Pain:  Vitals:   03/25/20 1508  TempSrc:   PainSc: Asleep      Patients Stated Pain Goal: 3 (95/09/32 6712)  Complications: No complications documented.

## 2020-03-25 NOTE — Discharge Instructions (Signed)
GASTRIC BYPASS/SLEEVE  Home Care Instructions   These instructions are to help you care for yourself when you go home.  Call: If you have any problems. . Call [phone] and ask for the surgeon on call . If you need immediate help, come to the ER at [facility].  . Tell the ER staff that you are a new post-op gastric bypass or gastric sleeve patient   Signs and symptoms to report: . Severe vomiting or nausea o If you cannot keep down clear liquids for longer than 1 day, call your surgeon  . Abdominal pain that does not get better after taking your pain medication . Fever over 100.4 F with chills . Heart beating over 100 beats a minute . Shortness of breath at rest . Chest pain .  Redness, swelling, drainage, or foul odor at incision (surgical) sites .  If your incisions open or pull apart . Swelling or pain in calf (lower leg) . Diarrhea (Loose bowel movements that happen often), frequent watery, uncontrolled bowel movements . Constipation, (no bowel movements for 3 days) if this happens: Pick one o Milk of Magnesia, 2 tablespoons by mouth, 3 times a day for 2 days if needed o Stop taking Milk of Magnesia once you have a bowel movement o Call your doctor if constipation continues Or o Miralax  (instead of Milk of Magnesia) following the label instructions o Stop taking Miralax once you have a bowel movement o Call your doctor if constipation continues . Anything you think is not normal   Normal side effects after surgery: . Unable to sleep at night or unable to focus . Irritability or moody . Being tearful (crying) or depressed These are common complaints, possibly related to your anesthesia medications that put you to sleep, stress of surgery, and change in lifestyle.  This usually goes away a few weeks after surgery.  If these feelings continue, call your primary care doctor.   Wound Care: You may have surgical glue, steri-strips, or staples over your incisions after  surgery . Surgical glue:  Looks like a clear film over your incisions and will wear off a little at a time . Steri-strips: Strips of tape over your incisions. You may notice a yellowish color on the skin under the steri-strips. This is used to make the   steri-strips stick better. Do not pull the steri-strips off - let them fall off . Staples: Jodell Cipro may be removed before you leave the hospital o If you go home with staples, call [facility], [phone]) [phone] at for an appointment with your surgeon's nurse to have staples removed 10 days after surgery. . Showering: You may shower two (2) days after your surgery unless your surgeon tells you differently o Wash gently around incisions with warm soapy water, rinse well, and gently pat dry  o No tub baths until staples are removed, steri-strips fall off or glue is gone.    Medications: Marland Kitchen Medications should be liquid or crushed if larger than the size of a dime . Extended release pills (medication that release a little bit at a time through the day) should NOT be crushed or cut. (examples include XL, ER, DR, SR) . Depending on the size and number of medications you take, you may need to space (take a few throughout the day)/change the time you take your medications so that you do not over-fill your pouch (smaller stomach) . Make sure you follow-up with your primary care doctor to  make medication changes needed during rapid weight loss and life-style changes . If you have diabetes, follow up with the doctor that orders your diabetes medication(s) within one week after surgery and check your blood sugar regularly. . Do not drive while taking prescription pain medication  . It is ok to take Tylenol by the bottle instructions with your pain medicine or instead of your pain medicine as needed.  DO NOT TAKE NSAIDS (EXAMPLES OF NSAIDS:  IBUPROFREN/ NAPROXEN)  Diet:                    First 2 Weeks  You will see the dietician t about two (2) weeks  after your surgery. The dietician will increase the types of foods you can eat if you are handling liquids well: Marland Kitchen If you have severe vomiting or nausea and cannot keep down clear liquids lasting longer than 1 day, call your surgeon @ [phone]) Protein Shake . Drink at least 2 ounces of shake 5-6 times per day . Each serving of protein shakes (usually 8 - 12 ounces) should have: o 15 grams of protein  o And no more than 5 grams of carbohydrate  . Goal for protein each day: o Men = 80 grams per day o Women = 60 grams per day . Protein powder may be added to fluids such as non-fat milk or Lactaid milk or unsweetened Soy/Almond milk (limit to 35 grams added protein powder per serving)  Hydration . Slowly increase the amount of water and other clear liquids as tolerated (See Acceptable Fluids) . Slowly increase the amount of protein shake as tolerated  .  Sip fluids slowly and throughout the day.  Do not use straws. . May use sugar substitutes in small amounts (no more than 6 - 8 packets per day; i.e. Splenda)  Fluid Goal . The first goal is to drink at least 8 ounces of protein shake/drink per day (or as directed by the nutritionist); some examples of protein shakes are [employer], [employer], EAS Edge HP, and Unjury. See handout from pre-op Bariatric Education Class: o Slowly increase the amount of protein shake you drink as tolerated o You may find it easier to slowly sip shakes throughout the day o It is important to get your proteins in first . Your fluid goal is to drink 64 - 100 ounces of fluid daily o It may take a few weeks to build up to this . 32 oz (or more) should be clear liquids  And  . 32 oz (or more) should be full liquids (see below for examples) . Liquids should not contain sugar, caffeine, or carbonation  Clear Liquids: . Water or Sugar-free flavored water (i.e. Fruit H2O, Propel) . Decaffeinated coffee or tea (sugar-free) . [employer], [employer],  Minute [facility] . Sugar-free Jell-O . Bouillon or broth . Sugar-free Popsicle:   *Less than 20 calories each; Limit 1 per day  Full Liquids: Protein Shakes/Drinks + 2 choices per day of other full liquids . Full liquids must be: o No More Than 15 grams of Carbs per serving  o No More Than 3 grams of Fat per serving . Strained low-fat cream soup (except Cream of Potato or Tomato) . Non-Fat milk . Fat-free Lactaid Milk . Unsweetened Soy Or Unsweetened Almond Milk . Low Sugar yogurt (Dannon Lite & Fit, [place] yogurt; Oikos Triple Zero; Chobani Simply 100; Yoplait 100 calorie [place] - No Fruit on the Bottom)    Vitamins  and Minerals . Start 1 day after surgery unless otherwise directed by your surgeon . Chewable Bariatric Specific Multivitamin / Multimineral Supplement with iron (Example: Bariatric Advantage Multi EA) . Chewable Calcium with Vitamin D-3 (Example: 3 Chewable Calcium Plus 600 with Vitamin D-3) o Take 500 mg three (3) times a day for a total of 1500 mg each day o Do not take all 3 doses of calcium at one time as it may cause constipation, and you can only absorb 500 mg  at a time  o Do not mix multivitamins containing iron with calcium supplements; take 2 hours apart . Menstruating women and those with a history of anemia (a blood disease that causes weakness) may need extra iron o Talk with your doctor to see if you need more iron . Do not stop taking or change any vitamins or minerals until you talk to your dietitian or surgeon . Your Dietitian and/or surgeon must approve all vitamin and mineral supplements   Activity and Exercise: Limit your physical activity as instructed by your doctor.  It is important to continue walking at home.  During this time, use these guidelines: . Do not lift anything greater than ten (10) pounds for at least two (2) weeks . Do not go back to work or drive until Engineer, production says you can . You may have sex when you feel comfortable  o It is  VERY important for female patients to use a reliable birth control method; fertility often increases after surgery  o All hormonal birth control will be ineffective for 30 days after surgery due to medications given during surgery a barrier method must be used. o Do not get pregnant for at least 18 months . Start exercising as soon as your doctor tells you that you can o Make sure your doctor approves any physical activity . Start with a simple walking program . Walk 5-15 minutes each day, 7 days per week.  . Slowly increase until you are walking 30-45 minutes per day Consider joining our [place] program. [phone] or email [email or web address]   Special Instructions Things to remember: . Use your CPAP when sleeping if this applies to you  . [facility] has two free Bariatric Surgery Support Groups that meet monthly o The 3rd Thursday of each month, 6 pm o The 2nd Friday of each month, 11:30 . It is very important to keep all follow up appointments with your surgeon, dietitian, primary care physician, and behavioral health practitioner . Routine follow up schedule with your surgeon include appointments at 2-3 weeks, 6-8 weeks, 6 months, and 1 year at a minimum.  Your surgeon may request to see you more often. . After the first year, please follow up with your bariatric surgeon and dietitian at least once a year in order to maintain best weight loss results   [facility]: [place]: [phone] Bariatric Nurse Coordinator: [phone]      Reviewed and Endorsed  by [facility] Patient Education Committee, June, 2016 Edits Approved: Aug, 2018

## 2020-03-25 NOTE — Progress Notes (Signed)
Pt arousable but goes back to sleep, unable to complete education on incentive spirometry at this time.

## 2020-03-25 NOTE — Interval H&P Note (Signed)
History and Physical Interval Note:  03/25/2020 12:27 PM  Jill Cochran  has presented today for surgery, with the diagnosis of MORBID OBESITY.  The various methods of treatment have been discussed with the patient and family. After consideration of risks, benefits and other options for treatment, the patient has consented to  Procedure(s): LAPAROSCOPIC GASTRIC SLEEVE RESECTION (N/A) UPPER GI ENDOSCOPY (N/A) as a surgical intervention.  The patient's history has been reviewed, patient examined, no change in status, stable for surgery.  I have reviewed the patient's chart and labs.  Questions were answered to the patient's satisfaction.     Pedro Earls

## 2020-03-25 NOTE — Anesthesia Preprocedure Evaluation (Addendum)
Anesthesia Evaluation  Patient identified by MRN, date of birth, ID band Patient awake    Reviewed: Allergy & Precautions, NPO status , Patient's Chart, lab work & pertinent test results  History of Anesthesia Complications (+) PONV and history of anesthetic complications  Airway Mallampati: II  TM Distance: >3 FB Neck ROM: Full    Dental  (+) Teeth Intact   Pulmonary sleep apnea ,    Pulmonary exam normal breath sounds clear to auscultation       Cardiovascular + Peripheral Vascular Disease  Normal cardiovascular exam Rhythm:Regular Rate:Normal     Neuro/Psych PSYCHIATRIC DISORDERS Depression negative neurological ROS     GI/Hepatic Neg liver ROS, GERD  Medicated,  Endo/Other  Obesity   Renal/GU Renal InsufficiencyRenal disease     Musculoskeletal negative musculoskeletal ROS (+)   Abdominal   Peds  Hematology negative hematology ROS (+)   Anesthesia Other Findings Day of surgery medications reviewed with the patient.  Reproductive/Obstetrics                             Anesthesia Physical Anesthesia Plan  ASA: II  Anesthesia Plan: General   Post-op Pain Management:    Induction: Intravenous  PONV Risk Score and Plan: 4 or greater and Midazolam, Dexamethasone, Ondansetron, Scopolamine patch - Pre-op and Diphenhydramine  Airway Management Planned: Oral ETT  Additional Equipment:   Intra-op Plan:   Post-operative Plan: Extubation in OR  Informed Consent: I have reviewed the patients History and Physical, chart, labs and discussed the procedure including the risks, benefits and alternatives for the proposed anesthesia with the patient or authorized representative who has indicated his/her understanding and acceptance.       Plan Discussed with: CRNA  Anesthesia Plan Comments:         Anesthesia Quick Evaluation

## 2020-03-25 NOTE — Anesthesia Postprocedure Evaluation (Signed)
Anesthesia Post Note  Patient: Jill Cochran  Procedure(s) Performed: LAPAROSCOPIC GASTRIC SLEEVE RESECTION WITH HIATAL HERNIA REPAIR (N/A ) UPPER GI ENDOSCOPY (N/A )     Patient location during evaluation: PACU Anesthesia Type: General Level of consciousness: awake and alert Pain management: pain level controlled Vital Signs Assessment: post-procedure vital signs reviewed and stable Respiratory status: spontaneous breathing, nonlabored ventilation and respiratory function stable Cardiovascular status: blood pressure returned to baseline and stable Postop Assessment: no apparent nausea or vomiting Anesthetic complications: no   No complications documented.  Last Vitals:  Vitals:   03/25/20 1645 03/25/20 1709  BP: (!) 171/94 (!) 158/92  Pulse: 78 82  Resp: 15 18  Temp: (!) 36.3 C (!) 36.4 C  SpO2: 99% 97%    Last Pain:  Vitals:   03/25/20 1709  TempSrc: Oral  PainSc:                  Catalina Gravel

## 2020-03-25 NOTE — Op Note (Signed)
25 March 2020  Surgeon: Kaylyn Lim, MD, FACS  Asst:  Greer Pickerel, MD, FACS  Anes:  General endotracheal  Procedure: Laparoscopic repair of hiatus hernia and  sleeve gastrectomy and upper endoscopy  Diagnosis: Morbid obesity  Complications: None noted  EBL:   minimal cc  Description of Procedure:  The patient was take to OR 2 and given general anesthesia.  The abdomen was prepped with Technicare and draped sterilely.  A timeout was performed.  Access to the abdomen was achieved with a 5 mm Optiview.  Following insufflation, the state of the abdomen was found to be free of adhesions.  There was a prominent small hiatal hernia.  Foregut dissection was done and two substantial crura were identified posteriorly.  These were closed with 2 Surgideks and TyKnots.  The ViSiGi 36Fr tube was inserted to deflate the stomach and was pulled back into the esophagus.    The pylorus was identified and we measured 5 cm back and marked the antrum.  At that point we began dissection to take down the greater curvature of the stomach using the Harmonic scalpel.  This dissection was taken all the way up to the left crus.  Posterior attachments of the stomach were also taken down.    The ViSiGi tube was then passed into the antrum and suction applied so that it was snug along the lessor curvature.  The "crow's foot" or incisura was identified.  The sleeve gastrectomy was begun using the [employer] stapler beginning with a 4.5 black load with TRS followed by a 6 cm black with TRS and then 6 cm purple loads with TRS.  Marland Kitchen  When the sleeve was complete the tube was taken off suction and insufflated briefly.  The tube was withdrawn.  Upper endoscopy was then performed by Dr. Redmond Pulling.     The specimen was extracted through the 15 trocar site.  Local was provided by infiltrating with Exparel tAP block and closed 4-0 Monocryl and Dermabond.    Matt B. Hassell Done, [place], [facility],  [place]

## 2020-03-25 NOTE — Anesthesia Procedure Notes (Signed)
Procedure Name: Intubation Date/Time: 03/25/2020 1:04 PM Performed by: Eulas Post, Cing Chatham W, CRNA Pre-anesthesia Checklist: Patient identified, Emergency Drugs available, Suction available and Patient being monitored Patient Re-evaluated:Patient Re-evaluated prior to induction Oxygen Delivery Method: Circle system utilized Preoxygenation: Pre-oxygenation with 100% oxygen Induction Type: IV induction Ventilation: Mask ventilation without difficulty Laryngoscope Size: Miller and 2 Grade View: Grade I Tube type: Oral Tube size: 7.0 mm Number of attempts: 1 Airway Equipment and Method: Stylet Placement Confirmation: ETT inserted through vocal cords under direct vision,  positive ETCO2 and breath sounds checked- equal and bilateral Secured at: 22 cm Tube secured with: Tape Dental Injury: Teeth and Oropharynx as per pre-operative assessment

## 2020-03-25 NOTE — Op Note (Signed)
VETTA COUZENS [id number] [birth year] 03/25/2020  Preoperative diagnosis: morbid obesity  Postoperative diagnosis: Same   Procedure: upper endoscopy   Surgeon: Leighton Ruff. Angeleah Labrake M.D., FACS   Anesthesia: Gen.   Indications for procedure: 49 y.o. year old female undergoing Laparoscopic Gastric Sleeve Resection and an EGD was requested to evaluate the new gastric sleeve.   Description of procedure: After we have completed the sleeve resection, I scrubbed out and obtained the Olympus endoscope. I gently placed endoscope in the patient's oropharynx and gently glided it down the esophagus without any difficulty under direct visualization. Once I was in the gastric sleeve, I insufflated the stomach with air. I was able to cannulate and advanced the scope through the gastric sleeve. I was able to cannulate the duodenum with ease. Dr. Hassell Done had placed saline in the upper abdomen. Upon further insufflation of the gastric sleeve there was no evidence of bubbles. GE junction located at 38 cm.  Upon further inspection of the gastric sleeve, the mucosa appeared normal. There is no evidence of any mucosal abnormality. The sleeve was widely patent at the angularis. There was no evidence of bleeding. The gastric sleeve was decompressed. The scope was withdrawn. The patient tolerated this portion of the procedure well. Please see Dr Earlie Server operative note for details regarding the laparoscopic gastric sleeve resection.   Leighton Ruff. Redmond Pulling, MD, FACS  General, Bariatric, & Minimally Invasive Surgery  [facility], [place]

## 2020-03-25 NOTE — Progress Notes (Signed)
Patient sleeping, discussed goals with Bedside RN. Information for BSTOP education provided including BSTOP information guide, "Guide for Pain Management after your Bariatric Procedure".

## 2020-03-25 NOTE — Progress Notes (Signed)
Pt refusing CPAP QHS at this time.  

## 2020-03-26 ENCOUNTER — Other Ambulatory Visit: Payer: Self-pay

## 2020-03-26 ENCOUNTER — Encounter (HOSPITAL_COMMUNITY): Payer: Self-pay | Admitting: Surgery

## 2020-03-26 LAB — CBC WITH DIFFERENTIAL/PLATELET
Abs Immature Granulocytes: 0.04 10*3/uL (ref 0.00–0.07)
Basophils Absolute: 0 10*3/uL (ref 0.0–0.1)
Basophils Relative: 0 %
Eosinophils Absolute: 0 10*3/uL (ref 0.0–0.5)
Eosinophils Relative: 0 %
HCT: 41.3 % (ref 36.0–46.0)
Hemoglobin: 13.7 g/dL (ref 12.0–15.0)
Immature Granulocytes: 1 %
Lymphocytes Relative: 9 %
Lymphs Abs: 0.7 10*3/uL (ref 0.7–4.0)
MCH: 28.8 pg (ref 26.0–34.0)
MCHC: 33.2 g/dL (ref 30.0–36.0)
MCV: 86.8 fL (ref 80.0–100.0)
Monocytes Absolute: 0.2 10*3/uL (ref 0.1–1.0)
Monocytes Relative: 3 %
Neutro Abs: 7 10*3/uL (ref 1.7–7.7)
Neutrophils Relative %: 87 %
Platelets: 175 10*3/uL (ref 150–400)
RBC: 4.76 MIL/uL (ref 3.87–5.11)
RDW: 12.3 % (ref 11.5–15.5)
WBC: 8 10*3/uL (ref 4.0–10.5)
nRBC: 0 % (ref 0.0–0.2)

## 2020-03-26 LAB — SURGICAL PATHOLOGY

## 2020-03-26 NOTE — Progress Notes (Signed)
Slow fluid intake due to nausea.  Walked in hallway, IS demonstrated.  Continue to sip fluids as tolerated.  Zofran given by bedside RN. Pain controlled with Tylenol

## 2020-03-26 NOTE — Progress Notes (Signed)
Patient ID: Jill Cochran, female   DOB: 09-28-1970, 49 y.o.   MRN: [id number] [facility] Surgery Progress Note:   1 Day Post-Op  Subjective: Mental status is clear.  Complaints none. Objective: Vital signs in last 24 hours: Temp:  [97.4 F (36.3 C)-98.1 F (36.7 C)] 98.1 F (36.7 C) (10/19 0939) Pulse Rate:  [71-100] 80 (10/19 0939) Resp:  [14-18] 17 (10/19 0939) BP: (142-188)/(88-106) 168/92 (10/19 0939) SpO2:  [96 %-100 %] 98 % (10/19 0939)  Intake/Output from previous day: 10/18 0701 - 10/19 0700 In: 2388.1 [P.O.:120; I.V.:2168.1; IV Piggyback:100] Out: 1100 [Urine:1100] Intake/Output this shift: Total I/O In: 240 [P.O.:240] Out: 300 [Urine:300]  Physical Exam: Work of breathing is normal.  Having some nausea with drinking.  Not drinking enough for discharge at this time.    Lab Results:  Results for orders placed or performed during the hospital encounter of 03/25/20 (from the past 48 hour(s))  ABO/Rh     Status: None   Collection Time: 03/25/20 11:50 AM  Result Value Ref Range   ABO/RH(D)      O POS Performed at [facility], [place] [address] [place], [place] [address]   Surgical pathology     Status: None   Collection Time: 03/25/20  2:12 PM  Result Value Ref Range   SURGICAL PATHOLOGY      SURGICAL PATHOLOGY CASE: [id number] PATIENT: Kimiye BATTLES Surgical Pathology Report     Clinical History: Morbid obesity; greater curvature of stomach (crm)     FINAL MICROSCOPIC DIAGNOSIS:  A. STOMACH, SLEEVE RESECTION: -Gross diagnosis only: Portion of unremarkable stomach.   GROSS DESCRIPTION:  Received fresh is a 23 x 5 x 2 cm intact portion of stomach with an embedded staple line.  Serosa is smooth, tan-pink with minimal attached adipose tissue.  Upon opening, stomach is filled with gelatinous red-brown material.  Wall averages 0.1 cm.  Mucosa is tan-pink with normal rugal folding.  Specimen is for gross only.  (AK  03/26/2020)   Final Diagnosis performed by Claudette Laws, MD.   Electronically signed 03/26/2020 Technical component performed at [facility], [place] [address], [place], [place] [address].  Professional component performed at [employer], [place] [address] [place], [place] [address].  Immunohistoche Museum/gallery curator component (if applicable) was performed at [facility]. [address], [place], [place],  [address].   IMMUNOHISTOCHEMISTRY DISCLAIMER (if applicable): Some of these immunohistochemical stains may have been developed and the performance characteristics determine by [facility]. Some may not have been cleared or approved by the U.S. Food and Drug Administration. The FDA has determined that such clearance or approval is not necessary. This test is used for clinical purposes. It should not be regarded as investigational or for research. This laboratory is certified under the [place] (CLIA-88) as qualified to perform high complexity clinical laboratory testing.  The controls stained appropriately.   CBC per protocol     Status: None   Collection Time: 03/25/20  3:21 PM  Result Value Ref Range   WBC 6.6 4.0 - 10.5 K/uL   RBC 4.90 3.87 - 5.11 MIL/uL   Hemoglobin 14.1 12.0 - 15.0 g/dL   HCT 42.7 36 - 46 %   MCV 87.1 80.0 - 100.0 fL   MCH 28.8 26.0 - 34.0 pg   MCHC 33.0 30.0 - 36.0 g/dL   RDW 12.4 11.5 - 15.5 %   Platelets 157 150 -  400 K/uL   nRBC 0.0 0.0 - 0.2 %    Comment: Performed at [facility], [place] [address], [place], [place] [address]  Creatinine, serum     Status: Abnormal   Collection Time: 03/25/20  3:21 PM  Result Value Ref Range   Creatinine, Ser 1.08 (H) 0.44 - 1.00 mg/dL   GFR, Estimated 59 (L) >60 mL/min    Comment: Performed at [facility], [place] [address], [place], [place] [address]  Hemoglobin and  hematocrit, blood     Status: None   Collection Time: 03/25/20  7:24 PM  Result Value Ref Range   Hemoglobin 14.3 12.0 - 15.0 g/dL   HCT 43.2 36 - 46 %    Comment: Performed at [facility], [place] [address], [place], [place] [address]  CBC WITH DIFFERENTIAL     Status: None   Collection Time: 03/26/20  4:46 AM  Result Value Ref Range   WBC 8.0 4.0 - 10.5 K/uL   RBC 4.76 3.87 - 5.11 MIL/uL   Hemoglobin 13.7 12.0 - 15.0 g/dL   HCT 41.3 36 - 46 %   MCV 86.8 80.0 - 100.0 fL   MCH 28.8 26.0 - 34.0 pg   MCHC 33.2 30.0 - 36.0 g/dL   RDW 12.3 11.5 - 15.5 %   Platelets 175 150 - 400 K/uL   nRBC 0.0 0.0 - 0.2 %   Neutrophils Relative % 87 %   Neutro Abs 7.0 1.7 - 7.7 K/uL   Lymphocytes Relative 9 %   Lymphs Abs 0.7 0.7 - 4.0 K/uL   Monocytes Relative 3 %   Monocytes Absolute 0.2 0.1 - 1.0 K/uL   Eosinophils Relative 0 %   Eosinophils Absolute 0.0 0.0 - 0.5 K/uL   Basophils Relative 0 %   Basophils Absolute 0.0 0.0 - 0.1 K/uL   Immature Granulocytes 1 %   Abs Immature Granulocytes 0.04 0.00 - 0.07 K/uL    Comment: Performed at [facility], [place] [address] [place],  [address]    Radiology/Results: No results found.  Anti-infectives: Anti-infectives (From admission, onward)   Start     Dose/Rate Route Frequency Ordered Stop   03/25/20 1115  cefoTEtan (CEFOTAN) 2 g in sodium chloride 0.9 % 100 mL IVPB        2 g 200 mL/hr over 30 Minutes Intravenous On call to O.R. 03/25/20 1111 03/25/20 1335      Assessment/Plan: Problem List: Patient Active Problem List   Diagnosis Date Noted  . 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 Varicose veins of lower extremities with other complications 73/41/9379    Nausea slowing PO intake.  Observation.   1 Day Post-Op    LOS: 1 day   Matt B. Hassell Done, MD, [facility] [phone] to reach the surgeon on call.    03/26/2020 1:18 PM

## 2020-03-26 NOTE — Progress Notes (Signed)
Patient alert and oriented, Post op day 1.  Provided support and encouragement.  Encouraged pulmonary toilet, ambulation and small sips of liquids.  All questions answered.  Will continue to monitor. 

## 2020-03-26 NOTE — Progress Notes (Signed)
Pt has refused CPAP for the night.  

## 2020-03-26 NOTE — Progress Notes (Signed)
Nutrition Brief Note  RD consulted for diet education for bariatric surgery. Bariatric nurse coordinator providing education.    If nutrition issues arise, please consult RD.   Clayton Bibles, MS, RD, LDN Inpatient Clinical Dietitian Contact information available via Amion

## 2020-03-26 NOTE — Progress Notes (Addendum)
PHARMACY CONSULT FOR:  Risk Assessment for Post-Discharge VTE Following Bariatric Surgery  Post-Discharge VTE Risk Assessment: This patient's probability of 30-day post-discharge VTE is increased due to the factors marked:   Female    Age >/=60 years    BMI >/=50 kg/m2    CHF    Dyspnea at Rest    Paraplegia  X  Non-gastric-band surgery    Operation Time >/=3 hr    Return to OR     Length of Stay >/= 3 d   Hx of VTE   Hypercoagulable condition   Significant venous stasis    Predicted probability of 30-day post-discharge VTE: 0.16%, Mild  Other patient-specific factors to consider: PMH includes venous insufficiency, Peripheral vascular disease.  Recommendation for Discharge: If significant chronic venous insufficiency present, may consider LMWH 40mg  SQ q12h for 2-4 weeks, otherwise, no VTE discharge prophylaxis is indicated  Addendum:  Per MD assessment, no VTE prophylaxis is needed.    Jill Cochran is a 49 y.o. female who underwent LAPAROSCOPIC GASTRIC SLEEVE RESECTION WITH HIATAL HERNIA REPAIR on 03/25/20.    CASE START Mon Mar 25, 2020 1331  CASE END Mon Mar 25, 2020 1458      Allergies  Allergen Reactions  . Ciprofloxacin Itching and Rash  . Sulfamethoxazole Itching and Rash    Patient Measurements: Height: 5\' 7"  (170.2 cm) Weight: 104.4 kg (230 lb 3.2 oz) IBW/kg (Calculated) : 61.6 Body mass index is 36.05 kg/m.  Recent Labs    03/25/20 1521 03/25/20 1924 03/26/20 0446  WBC 6.6  --  8.0  HGB 14.1 14.3 13.7  HCT 42.7 43.2 41.3  PLT 157  --  175  CREATININE 1.08*  --   --    Estimated Creatinine Clearance: 76.6 mL/min (A) (by C-G formula based on SCr of 1.08 mg/dL (H)).    Past Medical History:  Diagnosis Date  . Chronic renal failure    Secondary to Right  Kidney  renal calculus and GERD  . Complication of anesthesia   . Depression   . GERD (gastroesophageal reflux disease)   . History of kidney stones   . Peripheral vascular disease  ([place])    leaky vein in Rt leg. is now resolved  . PONV (postoperative nausea and vomiting)   . Sleep apnea 2021  . Venous insufficiency    at  [place]     Medications Prior to Admission  Medication Sig Dispense Refill Last Dose  . ALPRAZolam (XANAX) 0.25 MG tablet Take 0.25 mg by mouth daily as needed for anxiety.    03/24/2020 at Unknown time  . Cholecalciferol (VITAMIN D) 50 MCG (2000 UT) CAPS Take 2,000 Units by mouth daily.   03/23/2020  . FLUoxetine (PROZAC) 20 MG capsule Take 20 mg by mouth daily.   03/22/2020  . indapamide (LOZOL) 2.5 MG tablet Take 2.5 mg by mouth daily.    03/22/2020  . Multiple Vitamin (MULTIVITAMIN WITH MINERALS) TABS tablet Take 1 tablet by mouth daily.   03/24/2020 at Unknown time  . tretinoin (RETIN-A) 0.025 % cream Apply 1 application topically at bedtime.   03/24/2020 at Unknown time  . omeprazole (PRILOSEC) 10 MG capsule Take 10 mg by mouth daily as needed (reflux).  (Patient not taking: Reported on 03/14/2020)   Not Taking at Unknown time  . phentermine 30 MG capsule Take 30 mg by mouth every other day. (Patient not taking: Reported on 03/14/2020)  0 Not Taking at Unknown time  Gretta Arab PharmD, BCPS Clinical Pharmacist [facility] main pharmacy [phone] 03/26/2020 8:47 AM

## 2020-03-27 ENCOUNTER — Other Ambulatory Visit (HOSPITAL_COMMUNITY): Payer: Self-pay | Admitting: Surgery

## 2020-03-27 DIAGNOSIS — G4733 Obstructive sleep apnea (adult) (pediatric): Secondary | ICD-10-CM

## 2020-03-27 LAB — CBC WITH DIFFERENTIAL/PLATELET
Abs Immature Granulocytes: 0.05 10*3/uL (ref 0.00–0.07)
Basophils Absolute: 0 10*3/uL (ref 0.0–0.1)
Basophils Relative: 1 %
Eosinophils Absolute: 0 10*3/uL (ref 0.0–0.5)
Eosinophils Relative: 0 %
HCT: 40.9 % (ref 36.0–46.0)
Hemoglobin: 13.2 g/dL (ref 12.0–15.0)
Immature Granulocytes: 1 %
Lymphocytes Relative: 22 %
Lymphs Abs: 1.4 10*3/uL (ref 0.7–4.0)
MCH: 28.9 pg (ref 26.0–34.0)
MCHC: 32.3 g/dL (ref 30.0–36.0)
MCV: 89.5 fL (ref 80.0–100.0)
Monocytes Absolute: 0.4 10*3/uL (ref 0.1–1.0)
Monocytes Relative: 6 %
Neutro Abs: 4.4 10*3/uL (ref 1.7–7.7)
Neutrophils Relative %: 70 %
Platelets: 183 10*3/uL (ref 150–400)
RBC: 4.57 MIL/uL (ref 3.87–5.11)
RDW: 12.9 % (ref 11.5–15.5)
WBC: 6.2 10*3/uL (ref 4.0–10.5)
nRBC: 0 % (ref 0.0–0.2)

## 2020-03-27 MED ORDER — PANTOPRAZOLE SODIUM 40 MG PO TBEC: 40.0000 mg | DELAYED_RELEASE_TABLET | Freq: Every day | ORAL | 0 refills | Status: DC

## 2020-03-27 MED ORDER — ONDANSETRON 4 MG PO TBDP: 4.0000 mg | ORAL_TABLET | Freq: Four times a day (QID) | ORAL | 0 refills | Status: DC | PRN

## 2020-03-27 MED ORDER — OXYCODONE HCL 5 MG PO TABS: 5.0000 mg | ORAL_TABLET | Freq: Four times a day (QID) | ORAL | 0 refills | Status: DC | PRN

## 2020-03-27 NOTE — Progress Notes (Signed)
Patient alert and oriented, Post op day 2.  Provided support and encouragement.  Encouraged pulmonary toilet, ambulation and small sips of liquids. Working on protein this am.   All questions answered.  Will continue to monitor.

## 2020-03-27 NOTE — Discharge Summary (Signed)
Physician Discharge Summary  Patient ID: Jill Cochran MRN: [id number] DOB/AGE: [birth year] 51 y.o.  PCP: Kathyrn Lass, MD  Admit date: 03/25/2020 Discharge date: 03/27/2020  Admission Diagnoses:  Morbid obesity and OSA  Discharge Diagnoses:  same  Active Problems:   S/P laparoscopic sleeve gastrectomy   OSA (obstructive sleep apnea)   Surgery:  Lap repair of hiatal hernia and lap sleeve gastrectomy  Discharged Condition: improved  Hospital Course:   Had surgery on Monday afternoon.  Begun on liquids which she took slowly.  PO intake slowly improved and she was getting enough for discharge on Wednesday.    Consults: none  Significant Diagnostic Studies: none    Discharge Exam: Blood pressure (!) 184/104, pulse 73, temperature 98.7 F (37.1 C), temperature source Oral, resp. rate 18, height 5\' 7"  (1.702 m), weight 104.4 kg, last menstrual period 06/08/2010, SpO2 99 %. Incisions ok.    Disposition: Discharge disposition: 01-Home or Self Care       Discharge Instructions    Ambulate hourly while awake   Complete by: As directed    Call MD for:  difficulty breathing, headache or visual disturbances   Complete by: As directed    Call MD for:  persistant dizziness or light-headedness   Complete by: As directed    Call MD for:  persistant nausea and vomiting   Complete by: As directed    Call MD for:  redness, tenderness, or signs of infection (pain, swelling, redness, odor or green/yellow discharge around incision site)   Complete by: As directed    Call MD for:  severe uncontrolled pain   Complete by: As directed    Call MD for:  temperature >101 F   Complete by: As directed    Diet bariatric full liquid   Complete by: As directed    Incentive spirometry   Complete by: As directed    Perform hourly while awake     Allergies as of 03/27/2020      Reactions   Ciprofloxacin Itching, Rash   Sulfamethoxazole Itching, Rash      Medication List    STOP  taking these medications   omeprazole 10 MG capsule Commonly known as: PRILOSEC   phentermine 30 MG capsule     TAKE these medications   ALPRAZolam 0.25 MG tablet Commonly known as: XANAX Take 0.25 mg by mouth daily as needed for anxiety.   FLUoxetine 20 MG capsule Commonly known as: PROZAC Take 20 mg by mouth daily.   indapamide 2.5 MG tablet Commonly known as: LOZOL Take 2.5 mg by mouth daily.   multivitamin with minerals Tabs tablet Take 1 tablet by mouth daily.   ondansetron 4 MG disintegrating tablet Commonly known as: ZOFRAN-ODT Take 1 tablet (4 mg total) by mouth every 6 (six) hours as needed for nausea or vomiting.   oxyCODONE 5 MG immediate release tablet Commonly known as: Oxy IR/ROXICODONE Take 1 tablet (5 mg total) by mouth every 6 (six) hours as needed for severe pain.   pantoprazole 40 MG tablet Commonly known as: PROTONIX Take 1 tablet (40 mg total) by mouth daily.   tretinoin 0.025 % cream Commonly known as: RETIN-A Apply 1 application topically at bedtime.   Vitamin D 50 MCG (2000 UT) Caps Take 2,000 Units by mouth daily.       Follow-up Information    Johnathan Hausen, MD. Go on 04/19/2020.   Specialty: General Surgery Why: at 330 pm Contact information: [place]  [place] [address] [phone]        Carlena Hurl, PA-C. Go on 05/21/2020.   Specialty: General Surgery Why: at 145 pm Contact information: [place] [address] [phone]               Signed: Pedro Earls 03/27/2020, 12:10 PM

## 2020-03-27 NOTE — Progress Notes (Signed)
Patient still has c/o nausea, given zofran. Patient declined protein and mentioned that she was told to drink what was given earlier during day shift and wait until this AM to attempt more this morning due to c/o nausea maybe caused by previous anesthesia given during surgery. Patient is tolerating sips of water by mouth. Dawson Bills, RN

## 2020-03-27 NOTE — Progress Notes (Signed)
Patient alert and oriented, pain is controlled. Patient is tolerating fluids, advanced to protein shake today, patient is tolerating well.  Reviewed Gastric sleeve discharge instructions with patient and patient is able to articulate understanding.  Provided information on BELT program, Support Group and [facility] outpatient pharmacy. All questions answered, will continue to monitor.  

## 2020-04-01 ENCOUNTER — Telehealth (HOSPITAL_COMMUNITY): Payer: Self-pay

## 2020-04-01 NOTE — Telephone Encounter (Signed)
Patient called to discuss post bariatric surgery follow up questions.  See below:   1.  Tell me about your pain and pain management?denies pain  2.  Let's talk about fluid intake.  How much total fluid are you taking in? 64+ fluids  3.  How much protein have you taken in the last 2 days? 60 grams of protein  4.  Have you had nausea?  Tell me about when have experienced nausea and what you did to help? denies  5.  Has the frequency or color changed with your urine?urine light in color  6.  Tell me what your incisions look like?no problems  7.  Have you been passing gas? BM?has had bm since discharge  8.  If a problem or question were to arise who would you call?  Do you know contact numbers for [facility], [facility], and [facility]?aware of how to contact services  9.  How has the walking going?walking around home and out of office  10.  How are your vitamins and calcium going?  How are you taking them?mv and calcium started

## 2020-04-06 ENCOUNTER — Telehealth: Payer: Self-pay | Admitting: Surgery

## 2020-04-06 NOTE — Telephone Encounter (Signed)
Patient called today with constipation and difficulty urinating. She was discharged on 10/20 after a gastric sleeve with Dr. Hassell Done on 10/18. She has had constipation postop and is taking Miralax and milk of magnesia, and has tried a suppository. She is now having liquid bowel movements but still feels that she isn't completely evacuating stool, and reports difficulty urinating due to "swelling" and perianal pain (although she is still able to urinate after sitting on the toilet). Denies fevers and vomiting. I recommended that she continue her bowel regimen and she can also try a dose of mag citrate if she still feels constipated. She should continue sipping on liquids throughout the day to ensure she is staying hydrated. Unclear if perianal pain is skin excoriation vs loose stools. I counseled her that if she is not able to urinate at all, or if symptoms progress, she will need to go to the ED for bladder catheterization. We will follow up with her on Monday and have her come to clinic for an exam if she is not feeling better.  Michaelle Birks, MD [facility] Surgery General, Hepatobiliary and Pancreatic Surgery 04/06/20 11:17 AM

## 2020-04-08 ENCOUNTER — Ambulatory Visit: Payer: Self-pay | Admitting: Surgery

## 2020-04-08 ENCOUNTER — Other Ambulatory Visit: Payer: Self-pay | Admitting: Surgery

## 2020-04-09 ENCOUNTER — Ambulatory Visit: Admission: RE | Admit: 2020-04-09 | Payer: 59 | Source: Ambulatory Visit

## 2020-04-09 ENCOUNTER — Encounter: Payer: 59 | Attending: Surgery | Admitting: Skilled Nursing Facility1

## 2020-04-09 ENCOUNTER — Other Ambulatory Visit: Payer: Self-pay

## 2020-04-09 DIAGNOSIS — E669 Obesity, unspecified: Secondary | ICD-10-CM | POA: Diagnosis present

## 2020-04-09 NOTE — Progress Notes (Signed)
2 Week Post-Operative Nutrition Class   Patient was seen on 08/02/18 for Post-Operative Nutrition education at the [facility].    Surgery date: 03/25/2020 Surgery type: sleeve Start weight at [facility]: 237 Weight today: 215.1   Body Composition Scale 04/09/2020  Total Body Fat % 40.9  Visceral Fat 12  Fat-Free Mass % 59   Total Body Water % 44   Muscle-Mass lbs 32  Body Fat Displacement          Torso  lbs 54.4         Left Leg  lbs 10.8         Right Leg  lbs 10.8         Left Arm  lbs 5.4         Right Arm   lbs 5.4     The following the learning objectives were met by the patient during this course:  Identifies Phase 3 (Soft, High Proteins) Dietary Goals and will begin from 2 weeks post-operatively to 2 months post-operatively  Identifies appropriate sources of fluids and proteins   Identifies appropriate fat sources and healthy verses unhealthy fat types    States protein recommendations and appropriate sources post-operatively  Identifies the need for appropriate texture modifications, mastication, and bite sizes when consuming solids  Identifies appropriate multivitamin and calcium sources post-operatively  Describes the need for physical activity post-operatively and will follow MD recommendations  States when to call healthcare provider regarding medication questions or post-operative complications   Handouts given during class include:  Phase 3A: Soft, High Protein Diet Handout  Phase 3 High Protein Meals  Healthy Fats   Follow-Up Plan: Patient will follow-up at [facility] in 6 weeks for 2 month post-op nutrition visit for diet advancement per MD.

## 2020-04-12 ENCOUNTER — Emergency Department (HOSPITAL_COMMUNITY): Payer: 59

## 2020-04-12 ENCOUNTER — Other Ambulatory Visit: Payer: Self-pay

## 2020-04-12 ENCOUNTER — Emergency Department (HOSPITAL_COMMUNITY)
Admission: EM | Admit: 2020-04-12 | Discharge: 2020-04-12 | Disposition: A | Discharge status: Home / Self Care | Payer: 59 | Attending: Emergency Medicine | Admitting: Emergency Medicine

## 2020-04-12 ENCOUNTER — Encounter (HOSPITAL_COMMUNITY): Payer: Self-pay

## 2020-04-12 DIAGNOSIS — R202 Paresthesia of skin: Secondary | ICD-10-CM | POA: Insufficient documentation

## 2020-04-12 DIAGNOSIS — Z87442 Personal history of urinary calculi: Secondary | ICD-10-CM | POA: Diagnosis not present

## 2020-04-12 DIAGNOSIS — R109 Unspecified abdominal pain: Secondary | ICD-10-CM | POA: Diagnosis present

## 2020-04-12 DIAGNOSIS — N200 Calculus of kidney: Secondary | ICD-10-CM

## 2020-04-12 DIAGNOSIS — K219 Gastro-esophageal reflux disease without esophagitis: Secondary | ICD-10-CM | POA: Diagnosis not present

## 2020-04-12 DIAGNOSIS — N3001 Acute cystitis with hematuria: Secondary | ICD-10-CM | POA: Insufficient documentation

## 2020-04-12 DIAGNOSIS — Z20822 Contact with and (suspected) exposure to covid-19: Secondary | ICD-10-CM | POA: Insufficient documentation

## 2020-04-12 DIAGNOSIS — N132 Hydronephrosis with renal and ureteral calculous obstruction: Secondary | ICD-10-CM | POA: Diagnosis not present

## 2020-04-12 DIAGNOSIS — E872 Acidosis: Secondary | ICD-10-CM

## 2020-04-12 DIAGNOSIS — M549 Dorsalgia, unspecified: Secondary | ICD-10-CM | POA: Insufficient documentation

## 2020-04-12 DIAGNOSIS — E8729 Other acidosis: Secondary | ICD-10-CM

## 2020-04-12 LAB — COMPREHENSIVE METABOLIC PANEL
ALT: 26 U/L (ref 0–44)
AST: 25 U/L (ref 15–41)
Albumin: 4.4 g/dL (ref 3.5–5.0)
Alkaline Phosphatase: 63 U/L (ref 38–126)
Anion gap: >20 — ABNORMAL HIGH (ref 5–15)
BUN: 15 mg/dL (ref 6–20)
CO2: 19 mmol/L — ABNORMAL LOW (ref 22–32)
Calcium: 9.9 mg/dL (ref 8.9–10.3)
Chloride: 99 mmol/L (ref 98–111)
Creatinine, Ser: 1.17 mg/dL — ABNORMAL HIGH (ref 0.44–1.00)
GFR, Estimated: 56 mL/min — ABNORMAL LOW (ref >60)
Glucose, Bld: 119 mg/dL — ABNORMAL HIGH (ref 70–99)
Potassium: 3.5 mmol/L (ref 3.5–5.1)
Sodium: 140 mmol/L (ref 135–145)
Total Bilirubin: 1.4 mg/dL — ABNORMAL HIGH (ref 0.3–1.2)
Total Protein: 7.8 g/dL (ref 6.5–8.1)

## 2020-04-12 LAB — CBC WITH DIFFERENTIAL/PLATELET
Abs Immature Granulocytes: 0.02 10*3/uL (ref 0.00–0.07)
Basophils Absolute: 0 10*3/uL (ref 0.0–0.1)
Basophils Relative: 1 %
Eosinophils Absolute: 0 10*3/uL (ref 0.0–0.5)
Eosinophils Relative: 0 %
HCT: 46 % (ref 36.0–46.0)
Hemoglobin: 15 g/dL (ref 12.0–15.0)
Immature Granulocytes: 0 %
Lymphocytes Relative: 17 %
Lymphs Abs: 1 10*3/uL (ref 0.7–4.0)
MCH: 28.4 pg (ref 26.0–34.0)
MCHC: 32.6 g/dL (ref 30.0–36.0)
MCV: 87 fL (ref 80.0–100.0)
Monocytes Absolute: 0.4 10*3/uL (ref 0.1–1.0)
Monocytes Relative: 7 %
Neutro Abs: 4.3 10*3/uL (ref 1.7–7.7)
Neutrophils Relative %: 75 %
Platelets: 194 10*3/uL (ref 150–400)
RBC: 5.29 MIL/uL — ABNORMAL HIGH (ref 3.87–5.11)
RDW: 13 % (ref 11.5–15.5)
WBC: 5.7 10*3/uL (ref 4.0–10.5)
nRBC: 0 % (ref 0.0–0.2)

## 2020-04-12 LAB — URINALYSIS, ROUTINE W REFLEX MICROSCOPIC
Bilirubin Urine: NEGATIVE
Glucose, UA: NEGATIVE mg/dL
Ketones, ur: 80 mg/dL — AB
Nitrite: NEGATIVE
Protein, ur: 30 mg/dL — AB
RBC / HPF: >50 RBC/hpf — ABNORMAL HIGH (ref 0–5)
Specific Gravity, Urine: 1.017 (ref 1.005–1.030)
WBC, UA: >50 WBC/hpf — ABNORMAL HIGH (ref 0–5)
pH: 6 (ref 5.0–8.0)

## 2020-04-12 LAB — PREGNANCY, URINE: Preg Test, Ur: NEGATIVE

## 2020-04-12 LAB — RESPIRATORY PANEL BY RT PCR (FLU A&B, COVID)
Influenza A by PCR: NEGATIVE
Influenza B by PCR: NEGATIVE
SARS Coronavirus 2 by RT PCR: NEGATIVE

## 2020-04-12 LAB — LACTIC ACID, PLASMA: Lactic Acid, Venous: 0.7 mmol/L (ref 0.5–1.9)

## 2020-04-12 LAB — I-STAT BETA HCG BLOOD, ED (MC, WL, AP ONLY): I-stat hCG, quantitative: 6.5 m[IU]/mL — ABNORMAL HIGH (ref <5)

## 2020-04-12 LAB — MAGNESIUM: Magnesium: 1.8 mg/dL (ref 1.7–2.4)

## 2020-04-12 LAB — LIPASE, BLOOD: Lipase: 52 U/L — ABNORMAL HIGH (ref 11–51)

## 2020-04-12 LAB — SALICYLATE LEVEL: Salicylate Lvl: <7 mg/dL — ABNORMAL LOW (ref 7.0–30.0)

## 2020-04-12 MED ORDER — KETOROLAC TROMETHAMINE 30 MG/ML IJ SOLN
30.0000 mg | Freq: Once | INTRAMUSCULAR | Status: AC
  Administered 2020-04-12: 30 mg via INTRAVENOUS
  Filled 2020-04-12: qty 1

## 2020-04-12 MED ORDER — ONDANSETRON 4 MG PO TBDP: 4.0000 mg | ORAL_TABLET | Freq: Three times a day (TID) | ORAL | 0 refills | Status: DC | PRN

## 2020-04-12 MED ORDER — CEPHALEXIN 500 MG PO CAPS: 500.0000 mg | ORAL_CAPSULE | Freq: Four times a day (QID) | ORAL | 0 refills | Status: AC

## 2020-04-12 MED ORDER — CEFTRIAXONE SODIUM 1 G IJ SOLR: 1.0000 g | Freq: Once | INTRAMUSCULAR | Status: DC

## 2020-04-12 MED ORDER — ONDANSETRON HCL 4 MG/2ML IJ SOLN
4.0000 mg | Freq: Once | INTRAMUSCULAR | Status: AC
  Administered 2020-04-12: 4 mg via INTRAVENOUS
  Filled 2020-04-12: qty 2

## 2020-04-12 MED ORDER — SODIUM CHLORIDE 0.9 % IV SOLN
1.0000 g | Freq: Once | INTRAVENOUS | Status: AC
  Administered 2020-04-12: 1 g via INTRAVENOUS
  Filled 2020-04-12: qty 10

## 2020-04-12 MED ORDER — HYDROCODONE-ACETAMINOPHEN 5-325 MG PO TABS
1.0000 | ORAL_TABLET | Freq: Four times a day (QID) | ORAL | 0 refills | Status: DC | PRN
Start: 2020-04-12 — End: 2020-05-09

## 2020-04-12 MED ORDER — OXYCODONE-ACETAMINOPHEN 5-325 MG PO TABS
1.0000 | ORAL_TABLET | Freq: Once | ORAL | Status: AC
  Administered 2020-04-12: 1 via ORAL
  Filled 2020-04-12: qty 1

## 2020-04-12 MED ORDER — MORPHINE SULFATE (PF) 4 MG/ML IV SOLN
4.0000 mg | Freq: Once | INTRAVENOUS | Status: AC
  Administered 2020-04-12: 4 mg via INTRAVENOUS
  Filled 2020-04-12: qty 1

## 2020-04-12 MED ORDER — HYDROMORPHONE HCL 1 MG/ML IJ SOLN
1.0000 mg | Freq: Once | INTRAMUSCULAR | Status: AC
  Administered 2020-04-12: 1 mg via INTRAVENOUS
  Filled 2020-04-12: qty 1

## 2020-04-12 MED ORDER — SODIUM CHLORIDE 0.9 % IV BOLUS
1000.0000 mL | Freq: Once | INTRAVENOUS | Status: AC
  Administered 2020-04-12: 1000 mL via INTRAVENOUS

## 2020-04-12 MED ORDER — PROMETHAZINE HCL 25 MG/ML IJ SOLN
12.5000 mg | Freq: Once | INTRAMUSCULAR | Status: AC
  Administered 2020-04-12: 12.5 mg via INTRAVENOUS
  Filled 2020-04-12: qty 1

## 2020-04-12 MED ORDER — LACTATED RINGERS IV BOLUS
500.0000 mL | Freq: Once | INTRAVENOUS | Status: AC
  Administered 2020-04-12: 500 mL via INTRAVENOUS

## 2020-04-12 NOTE — Discharge Instructions (Addendum)
You are seen today for kidney stone, I prescribed you Zofran for your nausea, antibiotics for your possible UTI, and pain medication.  Your pain medication does have Tylenol, do not take more than 4 g of Tylenol in a day.  You can take naproxen or ibuprofen for your pain intake the Norco when this pain is severe.  Please schedule an appointment with Dr. Junious Silk as we spoke about for follow-up.  You were allergic to the Flomax.  Below is a printout of your CT, as we spoke about you need to have your renal cyst followed up.  Please make sure to stay hydrated and get plenty of rest.  In regards to your numbness and tingling, this is most likely anxiety as we spoke about, this occurs again I want you to talk to your primary care about this and possibly get a referral to neurology. Have your labs repeated in one week.   IMPRESSION: 1. Moderate RIGHT hydroureteronephrosis with mid RIGHT ureteral calculus measuring 6 x 7 mm. 2. LEFT renal pelvic calculus measuring 5 mm. No LEFT-sided ureteral calculus. 3. Questionable thickening along the margin of a RIGHT renal cyst. Given this possibility would suggest follow-up ultrasound for further assessment. 4. Post sleeve gastrectomy. 5. Fat containing umbilical hernia

## 2020-04-12 NOTE — ED Provider Notes (Signed)
[place] DEPT Provider Note   CSN: [id number] Arrival date & time: 04/12/20  0813     History Chief Complaint  Patient presents with  . Back Pain  . Extremity numbness     Jill Cochran is a 49 y.o. female with pertinent past medical history of OSA, nephrolithiasis, gastric sleeve surgery on 10/18 that presents emergency department today for complaint of right back pain and numbness in her extremities.  Patient states that she started having back pain that woke her up this morning on her right side, states that she does have an extensive history of kidney issues on the right side and have had nephrostomies in 2008 due to nephrolithiasis.  States that she has not had any kidney issues since 2008.  Also states that when she was getting ready to come here she started having extremity numbness on bilateral hands and feet, states that while she has been here for the past hour it has been slowly resolving.  States that she is never had anything like this before, triage note states that she was also hyperventilating upon arrival.  Denies any history of anxiety attacks that she is aware of.  Also admits to nausea and vomiting, states that she did vomit this morning, no hematemesis or bilious emesis.  No diarrhea.  Patient states that right back pain is constant, feels like someone is " placing a hammer on her back."  States that it feels similar to her previous kidney stones, is now radiating up into her abdomen.  No diarrhea, fevers, chills, chest pain, shortness of breath.  Denies any trauma to the area.  Denies any dysuria or hematuria, no vaginal problems, vaginal bleeding, vaginal discharge or pelvic pain.  Called her surgeon this morning, Dr. Hassell Done who is aware.  HPI     Past Medical History:  Diagnosis Date  . Chronic renal failure    Secondary to Right  Kidney  renal calculus and GERD  . Complication of anesthesia   . Depression   . GERD  (gastroesophageal reflux disease)   . History of kidney stones   . Peripheral vascular disease ([place])    leaky vein in Rt leg. is now resolved  . PONV (postoperative nausea and vomiting)   . Sleep apnea 2021  . Venous insufficiency    at  [facility]    Patient Active Problem List   Diagnosis Date Noted  . OSA (obstructive sleep apnea) 03/27/2020  . 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 Varicose veins of lower extremities with other complications 46/27/0350    Past Surgical History:  Procedure Laterality Date  . KIDNEY SURGERY Right 2008   percutaneous nephrolithotomies  . LAPAROSCOPIC GASTRIC SLEEVE RESECTION N/A 03/25/2020   Procedure: LAPAROSCOPIC GASTRIC SLEEVE RESECTION WITH HIATAL HERNIA REPAIR;  Surgeon: Johnathan Hausen, MD;  Location: [facility];  Service: General;  Laterality: N/A;  . TONSILLECTOMY     as a child  . UPPER GI ENDOSCOPY N/A 03/25/2020   Procedure: UPPER GI ENDOSCOPY;  Surgeon: Johnathan Hausen, MD;  Location: [facility];  Service: General;  Laterality: N/A;     OB History   No obstetric history on file.     Family History  Problem Relation Age of Onset  . Diabetes Mother   . Heart disease Mother   . Diabetes Father   . Heart disease Father     Social History   Tobacco Use  . Smoking status: Never Smoker  . Smokeless tobacco: Never  Used  Vaping Use  . Vaping Use: Never used  Substance Use Topics  . Alcohol use: Yes    Alcohol/week: 2.0 standard drinks    Types: 2 Standard drinks or equivalent per week  . Drug use: No    Home Medications Prior to Admission medications   Medication Sig Start Date End Date Taking? Authorizing Provider  ALPRAZolam Duanne Moron) 0.25 MG tablet Take 0.25 mg by mouth daily as needed for anxiety.  02/09/20   [provider]  cephALEXin (KEFLEX) 500 MG capsule Take 1 capsule (500 mg total) by mouth 4 (four) times daily for 5 days. 04/12/20 04/17/20  Alfredia Client, PA-C  Cholecalciferol (VITAMIN D) 50 MCG  (2000 UT) CAPS Take 2,000 Units by mouth daily.    [provider]  FLUoxetine (PROZAC) 20 MG capsule Take 20 mg by mouth daily. 02/05/20   [provider]  HYDROcodone-acetaminophen (NORCO/VICODIN) 5-325 MG tablet Take 1 tablet by mouth every 6 (six) hours as needed for severe pain. 04/12/20   Alfredia Client, PA-C  indapamide (LOZOL) 2.5 MG tablet Take 2.5 mg by mouth daily.     [provider]  Multiple Vitamin (MULTIVITAMIN WITH MINERALS) TABS tablet Take 1 tablet by mouth daily.    [provider]  ondansetron (ZOFRAN ODT) 4 MG disintegrating tablet Take 1 tablet (4 mg total) by mouth every 8 (eight) hours as needed for nausea or vomiting. 04/12/20   Alfredia Client, PA-C  oxyCODONE (OXY IR/ROXICODONE) 5 MG immediate release tablet Take 1 tablet (5 mg total) by mouth every 6 (six) hours as needed for severe pain. 03/27/20   Johnathan Hausen, MD  pantoprazole (PROTONIX) 40 MG tablet Take 1 tablet (40 mg total) by mouth daily. 03/27/20   Johnathan Hausen, MD  tretinoin (RETIN-A) 0.025 % cream Apply 1 application topically at bedtime.    [provider]    Allergies    Ciprofloxacin and Sulfamethoxazole  Review of Systems   Review of Systems  Constitutional: Negative for chills, diaphoresis, fatigue and fever.  HENT: Negative for congestion, sore throat and trouble swallowing.   Eyes: Negative for pain and visual disturbance.  Respiratory: Negative for cough, shortness of breath and wheezing.   Cardiovascular: Negative for chest pain, palpitations and leg swelling.  Gastrointestinal: Positive for abdominal pain, nausea and vomiting. Negative for abdominal distention and diarrhea.  Genitourinary: Positive for flank pain. Negative for difficulty urinating, dysuria, frequency, hematuria, menstrual problem and urgency.  Musculoskeletal: Positive for back pain. Negative for neck pain and neck stiffness.  Skin: Negative for pallor.  Neurological: Negative for  dizziness, speech difficulty, weakness and headaches.  Psychiatric/Behavioral: Negative for confusion.    Physical Exam Updated Vital Signs BP 128/75   Pulse 64   Temp 98.6 F (37 C) (Oral)   Resp 14   Ht 5\' 7"  (1.702 m)   Wt 97.5 kg   LMP 06/08/2010   SpO2 100%   BMI 33.67 kg/m   Physical Exam Constitutional:      General: She is not in acute distress.    Appearance: Normal appearance. She is not ill-appearing, toxic-appearing or diaphoretic.  HENT:     Mouth/Throat:     Mouth: Mucous membranes are moist.     Pharynx: Oropharynx is clear.  Eyes:     General: No scleral icterus.    Extraocular Movements: Extraocular movements intact.     Pupils: Pupils are equal, round, and reactive to light.  Cardiovascular:     Rate and Rhythm: Normal  rate and regular rhythm.     Pulses: Normal pulses.     Heart sounds: Normal heart sounds.  Pulmonary:     Effort: Pulmonary effort is normal. No respiratory distress.     Breath sounds: Normal breath sounds. No stridor. No wheezing, rhonchi or rales.  Chest:     Chest wall: No tenderness.  Abdominal:     General: Abdomen is flat. There is no distension.     Palpations: Abdomen is soft.     Tenderness: There is no abdominal tenderness. There is no guarding or rebound.     Comments: Incisions noted from recent gastric sleeve laparoscopic surgery.  Musculoskeletal:        General: No swelling or tenderness. Normal range of motion.     Cervical back: Normal range of motion and neck supple. No rigidity.       Back:     Right lower leg: No edema.     Left lower leg: No edema.     Comments: Tenderness to area depicted above on right back, no midline tenderness.  Prior incisions from previous surgeries noted, no erythema or warmth.  Skin:    General: Skin is warm and dry.     Capillary Refill: Capillary refill takes less than 2 seconds.     Coloration: Skin is not pale.  Neurological:     General: No focal deficit present.     Mental  Status: She is alert and oriented to person, place, and time.     Comments: Normal sensation and strength to bilateral upper and lower extremities, no objective numbness.  Pulses 2+ and equal.  Psychiatric:        Mood and Affect: Mood normal.        Behavior: Behavior normal.     ED Results / Procedures / Treatments   Labs (all labs ordered are listed, but only abnormal results are displayed) Labs Reviewed  COMPREHENSIVE METABOLIC PANEL - Abnormal; Notable for the following components:      Result Value   CO2 19 (*)    Glucose, Bld 119 (*)    Creatinine, Ser 1.17 (*)    Total Bilirubin 1.4 (*)    GFR, Estimated 56 (*)    Anion gap >20 (*)    All other components within normal limits  CBC WITH DIFFERENTIAL/PLATELET - Abnormal; Notable for the following components:   RBC 5.29 (*)    All other components within normal limits  LIPASE, BLOOD - Abnormal; Notable for the following components:   Lipase 52 (*)    All other components within normal limits  URINALYSIS, ROUTINE W REFLEX MICROSCOPIC - Abnormal; Notable for the following components:   APPearance HAZY (*)    Hgb urine dipstick LARGE (*)    Ketones, ur 80 (*)    Protein, ur 30 (*)    Leukocytes,Ua LARGE (*)    RBC / HPF >50 (*)    WBC, UA >50 (*)    Bacteria, UA RARE (*)    All other components within normal limits  I-STAT BETA HCG BLOOD, ED ([facility], [facility], [facility] ONLY) - Abnormal; Notable for the following components:   I-stat hCG, quantitative 6.5 (*)    All other components within normal limits  RESPIRATORY PANEL BY RT PCR (FLU A&B, COVID)  URINE CULTURE  MAGNESIUM  PREGNANCY, URINE    EKG EKG Interpretation  Date/Time:  Friday April 12 2020 08:28:52 EDT Ventricular Rate:  72 PR Interval:    QRS Duration: 100  QT Interval:  417 QTC Calculation: 457 R Axis:   43 Text Interpretation: Sinus rhythm No STEMI Confirmed by Octaviano Glow [phone]) on 04/12/2020 9:36:46 AM   Radiology CT Renal Stone Study  Addendum  Date: 04/12/2020   ADDENDUM REPORT: 04/12/2020 11:14 ADDENDUM: These results were called by telephone at the time of interpretation on 04/12/2020 at 11:14 am to provider [facility] Tashona Calk , who verbally acknowledged these results. Electronically Signed   By: Zetta Bills M.D.   On: 04/12/2020 11:14   Result Date: 04/12/2020 CLINICAL DATA:  Flank pain, suspected kidney stone EXAM: CT ABDOMEN AND PELVIS WITHOUT CONTRAST TECHNIQUE: Multidetector CT imaging of the abdomen and pelvis was performed following the standard protocol without IV contrast. COMPARISON:  07/05/2013 FINDINGS: Lower chest: Mild RIGHT middle lobe scarring. No consolidation. Basilar atelectasis. No effusion. Hepatobiliary: Liver with normal size and contour. No pericholecystic stranding. No gross biliary duct distension. Pancreas: Pancreas with normal contour and no signs of inflammation. Spleen: Spleen with subtle low-density lesion in the cephalad aspect with some capsular distortion but showing no change since 2015. Adrenals/Urinary Tract: Adrenal glands are normal. Moderate RIGHT hydroureteronephrosis with mid RIGHT ureteral calculus and perinephric stranding. Calculus measuring 6 x 7 mm in the mid RIGHT ureter. Cortical scarring with signs of prior RIGHT nephrostomy and low-density area along the margin of the RIGHT kidney that has enlarged but shows homogeneous low-density measuring 4.2 x 3.1 cm as compared to 3.0 x 2.7 cm in 2015. This shows the potential for thickened wall along its anterior margin. LEFT renal pelvic calculus measuring 5 mm. No LEFT-sided ureteral calculus. Urinary bladder is unremarkable. Stomach/Bowel: Post sleeve gastrectomy. Normal appearance of small bowel. Appendix not visualized though there are no secondary signs to suggest acute appendicitis. Stool fills much of the colon. No acute colonic process. Vascular/Lymphatic: Normal caliber abdominal aorta. There is no gastrohepatic or hepatoduodenal ligament lymphadenopathy.  No retroperitoneal or mesenteric lymphadenopathy. No pelvic sidewall lymphadenopathy. Reproductive: Uterus and adnexa, a grossly normal on CT. Other: Fat containing umbilical hernia.  No ascites. Musculoskeletal: Spinal degenerative changes. No acute or destructive bone process. IMPRESSION: 1. Moderate RIGHT hydroureteronephrosis with mid RIGHT ureteral calculus measuring 6 x 7 mm. 2. LEFT renal pelvic calculus measuring 5 mm. No LEFT-sided ureteral calculus. 3. Questionable thickening along the margin of a RIGHT renal cyst. Given this possibility would suggest follow-up ultrasound for further assessment. 4. Post sleeve gastrectomy. 5. Fat containing umbilical hernia. Electronically Signed: By: Zetta Bills M.D. On: 04/12/2020 11:08    Procedures Procedures (including critical care time)  Medications Ordered in ED Medications  morphine 4 MG/ML injection 4 mg (4 mg Intravenous Given 04/12/20 0943)  ondansetron (ZOFRAN) injection 4 mg (4 mg Intravenous Given 04/12/20 0944)  lactated ringers bolus 500 mL (0 mLs Intravenous Stopped 04/12/20 1357)  ondansetron (ZOFRAN) injection 4 mg (4 mg Intravenous Given 04/12/20 1051)  HYDROmorphone (DILAUDID) injection 1 mg (1 mg Intravenous Given 04/12/20 1200)  ketorolac (TORADOL) 30 MG/ML injection 30 mg (30 mg Intravenous Given 04/12/20 1400)  cefTRIAXone (ROCEPHIN) 1 g in sodium chloride 0.9 % 100 mL IVPB (0 g Intravenous Stopped 04/12/20 1552)  promethazine (PHENERGAN) injection 12.5 mg (12.5 mg Intravenous Given 04/12/20 1556)  sodium chloride 0.9 % bolus 1,000 mL (1,000 mLs Intravenous New Bag/Given (Non-Interop) 04/12/20 1640)    ED Course  I have reviewed the triage vital signs and the nursing notes.  Pertinent labs & imaging results that were available during my care of the patient were reviewed by  me and considered in my medical decision making (see chart for details).  Clinical Course as of Apr 12 1654  Fri Apr 12, 2020  1629 CT Renal Laren Everts [SP]     Clinical Course User Index [SP] Alfredia Client, PA-C   MDM Rules/Calculators/A&P                         BRITYN MASTROGIOVANNI is a 49 y.o. female with pertinent past medical history of OSA, nephrolithiasis, gastric sleeve surgery on 10/18 that presents emergency department today for complaint of right back pain and numbness in her extremities.  Differential diagnoses considered include pyelonephritis, nephrolithiasis, musculoskeletal, pancreatitis, lumbar strain.  Initial interventions fluids, morphine, Zofran.  ECG interpreted by me demonstrated no ischemic changes.  Labs demonstrated no leukocytosis, urinalysis does appear dirty, will obtain urine culture.  CMP with electrolyte derangements consistent with dehydration, 1-1/2 L of fluid have been given.  Anion gap greater than 20, this is most likely due to dehydration, no signs of sepsis, glucose 119, no uremia. No aspirin use, does not drink alcohol frequently, not a diabetic.   Upon reassessment patient still continues to complain of pain and nausea, Dilaudid and more Zofran has been given.  CT scan does show a large stone with hydronephrosis on the right side, will consult urology at this time due to infected urine left large stone.  Patient is afebrile and does have a normal white count.  Spoke to Dr. Junious Silk, urology who does not think that patient needs to be operated on at this time, states that she can be seen outpatient.  Patient expressed understanding, pain, nausea has been controlled. Symptomatic treatment discussed, patient is allergic to Flomax therefore we will not prescribe that at this time.  In regards to patient's numbness and tingling I think this was from anxiety, has completely gone away.  Pt care was handed off to Dr. Billy Fischer at 600.  Complete history and physical and current plan have been communicated.  Please refer to their note for the remainder of ED care and ultimate disposition.  Awaiting for salicylate and  lactic levels in regards to patient's large anion gap.  If these come back normal patient to be discharged.   I discussed this case with my attending physician who cosigned this note including patient's presenting symptoms, physical exam, and planned diagnostics and interventions. Attending physician stated agreement with plan or made changes to plan which were implemented.    final Clinical Impression(s) / ED Diagnoses Final diagnoses:  Nephrolithiasis  Acute cystitis with hematuria    Rx / DC Orders ED Discharge Orders         Ordered    cephALEXin (KEFLEX) 500 MG capsule  4 times daily        04/12/20 1537    HYDROcodone-acetaminophen (NORCO/VICODIN) 5-325 MG tablet  Every 6 hours PRN        04/12/20 1537    ondansetron (ZOFRAN ODT) 4 MG disintegrating tablet  Every 8 hours PRN        04/12/20 1537           Posey Pronto [place], PA-C 04/12/20 1758    Wyvonnia Dusky, MD 04/13/20 1520

## 2020-04-12 NOTE — ED Notes (Signed)
Pt given water at this time for PO/fluid challenge.  

## 2020-04-12 NOTE — ED Triage Notes (Signed)
Pt presents with c/o back pain and numbness in her extremities. Pt reports that she had gastric sleeve surgery on 10/18. Pt says she can't move her hands. Upon arrival, pt was hyperventilating. Pt is alert and oriented, answering all questions.

## 2020-04-13 LAB — URINE CULTURE: Culture: <10000 — AB

## 2020-04-15 ENCOUNTER — Telehealth: Payer: Self-pay | Admitting: Skilled Nursing Facility1

## 2020-04-15 NOTE — Telephone Encounter (Signed)
RD called pt to verify fluid intake once starting soft, solid proteins 2 week post-bariatric surgery.   Daily Fluid intake: 64 Daily Protein intake: 60+  Concerns/issues:   Nephrolithiasis 04/12/2020 (with history), states her right kidney functions at 30% anyway  Dietitian educated pt on plant based/vegetarian options. Pt states she will try that because she is not liking the meat proteins.

## 2020-04-29 ENCOUNTER — Other Ambulatory Visit: Payer: Self-pay | Admitting: Urology

## 2020-05-08 ENCOUNTER — Telehealth: Payer: Self-pay | Admitting: Skilled Nursing Facility1

## 2020-05-08 NOTE — Telephone Encounter (Signed)
At your next visit.  -----Original Message----- From: Alvia [email or web address]  Sent: Wednesday, May 08, 2020 11:39 AM To: Sandie [email or web address] Subject: Salad  *Caution - External email - see footer for warnings*  When can I add salad to my diet?!??  Sent from my iPhone WARNING: This email originated outside of [facility]. Even if this looks like a [employer], it is not. Do not provide your username, password, or any other personal information in response to this or any other email. [facility] will never ask you for your username or password via email. DO NOT CLICK links or attachments unless you are positive the content is safe. If in doubt about the safety of this message, select the [employer]ishing button, which forwards to IT Security.

## 2020-05-09 ENCOUNTER — Other Ambulatory Visit: Payer: Self-pay

## 2020-05-09 ENCOUNTER — Encounter (HOSPITAL_BASED_OUTPATIENT_CLINIC_OR_DEPARTMENT_OTHER): Payer: Self-pay | Admitting: Urology

## 2020-05-09 NOTE — Progress Notes (Addendum)
Spoke w/ via phone for pre-op interview---pt Lab needs dos----  I stat 8          Lab results------ekg 04-12-2020 epic COVID test ------05-13-2020 1230 pm Arrive at -------1300 pm 05-16-2020 NPO after MN NO Solid Food.  Clear liquids from MN until---1200 pm then npo Medications to take morning of surgery -----alprazolam prn, fluoxetine Diabetic medication -----n/a Patient Special Instructions -----none Pre-Op special Istructions -----none Patient verbalized understanding of instructions that were given at this phone interview. Patient denies shortness of breath, chest pain, fever, cough at this phone interview.  Pt given selita  Number to follow up, patient states not wanting a stent and stent placement is listed on surgery consent

## 2020-05-13 ENCOUNTER — Other Ambulatory Visit: Payer: Self-pay | Admitting: Urology

## 2020-05-13 ENCOUNTER — Other Ambulatory Visit (HOSPITAL_COMMUNITY)
Admission: RE | Admit: 2020-05-13 | Discharge: 2020-05-13 | Disposition: A | Discharge status: Home / Self Care | Payer: 59 | Source: Ambulatory Visit | Attending: Urology | Admitting: Urology

## 2020-05-13 DIAGNOSIS — Z20822 Contact with and (suspected) exposure to covid-19: Secondary | ICD-10-CM | POA: Diagnosis not present

## 2020-05-13 DIAGNOSIS — Z01812 Encounter for preprocedural laboratory examination: Secondary | ICD-10-CM | POA: Diagnosis not present

## 2020-05-13 LAB — SARS CORONAVIRUS 2 (TAT 6-24 HRS): SARS Coronavirus 2: NEGATIVE

## 2020-05-14 NOTE — Progress Notes (Signed)
Patient to arrive at [place] on 05/15/2020. History and medications reviewed. Pre-procedure instructions given. NPO after MN except for clear liquids until 0445. Driver secured.

## 2020-05-16 ENCOUNTER — Ambulatory Visit (HOSPITAL_BASED_OUTPATIENT_CLINIC_OR_DEPARTMENT_OTHER)
Admission: RE | Admit: 2020-05-16 | Discharge: 2020-05-16 | Disposition: A | Discharge status: Home / Self Care | Payer: 59 | Attending: Urology | Admitting: Urology

## 2020-05-16 ENCOUNTER — Ambulatory Visit (HOSPITAL_COMMUNITY): Payer: 59

## 2020-05-16 ENCOUNTER — Other Ambulatory Visit: Payer: Self-pay

## 2020-05-16 ENCOUNTER — Encounter (HOSPITAL_BASED_OUTPATIENT_CLINIC_OR_DEPARTMENT_OTHER)
Admission: RE | Disposition: A | Discharge status: Home / Self Care | Payer: Self-pay | Source: Home / Self Care | Attending: Urology

## 2020-05-16 ENCOUNTER — Encounter (HOSPITAL_BASED_OUTPATIENT_CLINIC_OR_DEPARTMENT_OTHER): Payer: Self-pay | Admitting: Urology

## 2020-05-16 DIAGNOSIS — N200 Calculus of kidney: Secondary | ICD-10-CM

## 2020-05-16 DIAGNOSIS — Z87442 Personal history of urinary calculi: Secondary | ICD-10-CM | POA: Insufficient documentation

## 2020-05-16 DIAGNOSIS — Z882 Allergy status to sulfonamides status: Secondary | ICD-10-CM | POA: Diagnosis not present

## 2020-05-16 DIAGNOSIS — Z881 Allergy status to other antibiotic agents status: Secondary | ICD-10-CM | POA: Diagnosis not present

## 2020-05-16 DIAGNOSIS — Z79899 Other long term (current) drug therapy: Secondary | ICD-10-CM | POA: Insufficient documentation

## 2020-05-16 HISTORY — DX: Personal history of other diseases of the nervous system and sense organs: Z86.69

## 2020-05-16 HISTORY — PX: EXTRACORPOREAL SHOCK WAVE LITHOTRIPSY: SHX1557

## 2020-05-16 SURGERY — LITHOTRIPSY, ESWL
Anesthesia: LOCAL | Laterality: Left

## 2020-05-16 SURGERY — CYSTOURETEROSCOPY, WITH RETROGRADE PYELOGRAM AND STENT INSERTION
Anesthesia: General | Laterality: Left

## 2020-05-16 MED ORDER — DIPHENHYDRAMINE HCL 25 MG PO CAPS
ORAL_CAPSULE | ORAL | Status: AC
  Filled 2020-05-16: qty 1

## 2020-05-16 MED ORDER — DIPHENHYDRAMINE HCL 25 MG PO CAPS
25.0000 mg | ORAL_CAPSULE | ORAL | Status: AC
  Administered 2020-05-16: 25 mg via ORAL

## 2020-05-16 MED ORDER — OXYCODONE-ACETAMINOPHEN 5-325 MG PO TABS
1.0000 | ORAL_TABLET | ORAL | 0 refills | Status: AC | PRN
Start: 2020-05-16 — End: 2021-05-16

## 2020-05-16 MED ORDER — ACETAMINOPHEN 500 MG PO TABS
1000.0000 mg | ORAL_TABLET | Freq: Once | ORAL | Status: AC
  Administered 2020-05-16: 1000 mg via ORAL

## 2020-05-16 MED ORDER — DIAZEPAM 5 MG PO TABS
10.0000 mg | ORAL_TABLET | ORAL | Status: AC
  Administered 2020-05-16: 10 mg via ORAL

## 2020-05-16 MED ORDER — ACETAMINOPHEN 500 MG PO TABS
ORAL_TABLET | ORAL | Status: AC
  Filled 2020-05-16: qty 2

## 2020-05-16 MED ORDER — CEPHALEXIN 500 MG PO CAPS
500.0000 mg | ORAL_CAPSULE | Freq: Once | ORAL | Status: AC
  Administered 2020-05-16: 500 mg via ORAL
  Filled 2020-05-16: qty 1

## 2020-05-16 MED ORDER — DIAZEPAM 5 MG PO TABS
ORAL_TABLET | ORAL | Status: AC
  Filled 2020-05-16: qty 2

## 2020-05-16 MED ORDER — SODIUM CHLORIDE 0.9 % IV SOLN: INTRAVENOUS | Status: DC

## 2020-05-16 NOTE — H&P (Signed)
have kidney stones.  HPI: Jill Cochran is a 49 year-old female patient who was referred by Dr. Kathyrn Lass, MD who is here for renal calculi.    Jill Cochran has had recent right flank and lower quadrant pain.this feels like prior stones. No gross hematuria. No fever, that she has had some mild chills.   11.8.2021: Pt notes that she recently underwent gastric sleeve surgery. She recently experienced an episode of back pain was treated with pain medication and her symptoms have since resolved.  She recently passed a 6-46mm stone shortly after she was seen in ER. Currently asymptomatic.     ALLERGIES: Ciprofloxacin Sulfa Drugs    MEDICATIONS: Fluoxetine Hcl  Indapamide 1.25 mg tablet Oral     GU PSH: None     PSH Notes: Kidney Surgery-percutaneous nephrostomy   NON-GU PSH: None   GU PMH: Acute Cystitis/UTI (Improving), This appears to be uncomplicated cystitis - 3557 Renal calculus, Right upper pole renal stone-apparently present at her last visit with Dr. Braulio Bosch. I think she is asymptomatic from this. - 2017, Bilateral kidney stones, - 2014 History of urolithiasis, Nephrolithiasis - 2014 Urinary Tract Inf, Unspec site, Urinary tract infection - 2014      PMH Notes:  2012-09-26 19:37:38 - Note: Flank Pain Left   Esophageal Reflux   NON-GU PMH: Anxiety Encounter for general adult medical examination without abnormal findings, Encounter for preventive health examination GERD Hypertension    FAMILY HISTORY: 1 Daughter - Runs in Family 1 son - Runs in Family Esophageal Cancer - Father Kidney Stones - Uncle   SOCIAL HISTORY: Marital Status: Single Preferred Language: English; Ethnicity: Not Hispanic Or Latino; Race: White Current Smoking Status: Patient has never smoked.  Does not use smokeless tobacco. Drinks 2 drinks per week.  Does not use drugs. Does not drink caffeine. Has not had a blood transfusion. Patient's occupation Artist.      Notes: Never A Smoker, Alcohol Use, Occupation:, Marital History - Currently Married, Caffeine Use   REVIEW OF SYSTEMS:    GU Review Female:   Patient denies frequent urination, hard to postpone urination, burning /pain with urination, get up at night to urinate, leakage of urine, stream starts and stops, trouble starting your stream, have to strain to urinate, and being pregnant.  Gastrointestinal (Upper):   Patient reports nausea and vomiting. Patient denies indigestion/ heartburn.  Gastrointestinal (Lower):   Patient reports constipation. Patient denies diarrhea.  Constitutional:   Patient denies fever, night sweats, weight loss, and fatigue.  Skin:   Patient denies skin rash/ lesion and itching.  Eyes:   Patient denies blurred vision and double vision.  Ears/ Nose/ Throat:   Patient denies sore throat and sinus problems.  Hematologic/Lymphatic:   Patient denies swollen glands and easy bruising.  Cardiovascular:   Patient denies leg swelling and chest pains.  Respiratory:   Patient denies cough and shortness of breath.  Endocrine:   Patient denies excessive thirst.  Musculoskeletal:   Patient reports back pain. Patient denies joint pain.  Neurological:   Patient denies headaches and dizziness.  Psychologic:   Patient denies depression and anxiety.   VITAL SIGNS:      04/15/2020 03:43 PM  Weight 215 lb / 97.52 kg  Height 67 in / 170.18 cm  BP 122/84 mmHg  Heart Rate 76 /min  Temperature 98.2 F / 36.7 C  BMI 33.7 kg/m   MULTI-SYSTEM PHYSICAL EXAMINATION:    Constitutional: Well-nourished. No physical deformities. Normally developed.  Good grooming.  Neck: Neck symmetrical, not swollen. Normal tracheal position.  Respiratory: No labored breathing, no use of accessory muscles.   Skin: No paleness, no jaundice, no cyanosis. No lesion, no ulcer, no rash.  Neurologic / Psychiatric: Oriented to time, oriented to place, oriented to person. No depression, no anxiety, no agitation.  Eyes:  Normal conjunctivae. Normal eyelids.  Ears, Nose, Mouth, and Throat: Left ear no scars, no lesions, no masses. Right ear no scars, no lesions, no masses. Nose no scars, no lesions, no masses. Normal hearing. Normal lips.  Musculoskeletal: Normal gait and station of head and neck.     Complexity of Data:  Records Review:   Previous Patient Records  X-Ray Review: KUB: Reviewed Films. Discussed With Patient. Comparative KUB from 2017 reviewed. C.T. Stone Protocol: Reviewed Films. Reviewed Report. Discussed With Patient. CT from last week reviewed    PROCEDURES:         KUB - 74018  A single view of the abdomen is obtained. Image independently reviewed. 4 by 7 mm Ca++ in rt renal pelvic/UPJ region present. No suggestion of Rt renal or ureteral stones.      Patient confirmed No Neulasta OnPro Device.           Urinalysis w/Scope - 81001 Dipstick Dipstick Cont'd Micro  Color: Yellow Bilirubin: Neg WBC/hpf: 20 - 40/hpf  Appearance: Cloudy Ketones: 2+ RBC/hpf: 3 - 10/hpf  Specific Gravity: 1.020 Blood: 3+ Bacteria: Few (10-25/hpf)  pH: 5.5 Protein: Trace Cystals: NS (Not Seen)  Glucose: Neg Urobilinogen: 0.2 Casts: NS (Not Seen)    Nitrites: Neg Trichomonas: Not Present    Leukocyte Esterase: 3+ Mucous: Not Present      Epithelial Cells: 10 - 20/hpf      Yeast: Few (1 - 5/hpf)      Sperm: Not Present    Notes:      ASSESSMENT:      ICD-10 Details  1 GU:   Renal calculus - N20.0 Undiagnosed New Problem - Lt renal pelvic stone.  2   Ureteral calculus - N20.1 Undiagnosed New Problem - Recently symptomatic Rt ureteral stone--passed   PLAN:            Medications Stop Meds: Aleve TABS Oral  Start: 07/22/2011  Discontinue: 04/15/2020  - Reason: The medication cycle was completed.  Allergy TABS Oral  Start: 09/26/2012  Discontinue: 04/15/2020  - Reason: The medication cycle was completed.  TraMADol HCl - 50 MG Oral Tablet 4 Oral  Start: 09/26/2012  Discontinue: 04/15/2020  -  Reason: The medication cycle was completed.  Tylenol PM Extra Strength TABS Oral  Start: 07/22/2011  Discontinue: 04/15/2020  - Reason: The medication cycle was completed.            Orders X-Rays: KUB          Schedule         Document Letter(s):  Created for Patient: Clinical Summary         Notes:   1. Pt advised regarding her renal calculi and treatment (shockwave & ureteroscopic lithotripsy) and reassured regarding her renal cyst.   2. Pt will consider shockwave vs ureteroscopy and f/u regarding her preference. She will call regarding her choice for stone mgmt.

## 2020-05-16 NOTE — Interval H&P Note (Signed)
History and Physical Interval Note:  05/16/2020 8:06 AM  Jill Cochran  has presented today for surgery, with the diagnosis of LEFT RENAL STONE.  The various methods of treatment have been discussed with the patient and family. After consideration of risks, benefits and other options for treatment, the patient has consented to  Procedure(s): EXTRACORPOREAL SHOCK WAVE LITHOTRIPSY (ESWL) (Left) as a surgical intervention.  The patient's history has been reviewed, patient examined, no change in status, stable for surgery.  I have reviewed the patient's chart and labs.  Questions were answered to the patient's satisfaction.     Remi Haggard

## 2020-05-16 NOTE — Op Note (Signed)
05/16/2020   8:15 AM   PATIENT:  Jill Cochran  49 y.o. female   PRE-OPERATIVE DIAGNOSIS:  LEFT RENAL STONE   POST-OPERATIVE DIAGNOSIS:  * No post-op diagnosis entered *   PROCEDURE:  Procedure(s): EXTRACORPOREAL SHOCK WAVE LITHOTRIPSY (ESWL) (Left)   SURGEON:  Surgeon(s) and Role:    * Remi Haggard, MD - Primary   PHYSICIAN ASSISTANT:    ASSISTANTS: none    ANESTHESIA:   IV sedation   EBL:  minimal    BLOOD ADMINISTERED:none   DRAINS: none    LOCAL MEDICATIONS USED:  NONE   SPECIMEN:  No Specimen   DISPOSITION OF SPECIMEN:  N/A   COUNTS:  YES   TOURNIQUET:  * No tourniquets in log *   DICTATION: .Note written in EPIC   PLAN OF CARE: Discharge to home after PACU   PATIENT DISPOSITION:  PACU - hemodynamically stable.   Delay start of Pharmacological VTE agent (>24hrs) due to surgical blood loss or risk of bleeding: not applicable

## 2020-05-16 NOTE — Brief Op Note (Signed)
05/16/2020  8:15 AM  PATIENT:  Jill Cochran  49 y.o. female  PRE-OPERATIVE DIAGNOSIS:  LEFT RENAL STONE  POST-OPERATIVE DIAGNOSIS:  * No post-op diagnosis entered *  PROCEDURE:  Procedure(s): EXTRACORPOREAL SHOCK WAVE LITHOTRIPSY (ESWL) (Left)  SURGEON:  Surgeon(s) and Role:    * Remi Haggard, MD - Primary  PHYSICIAN ASSISTANT:   ASSISTANTS: none   ANESTHESIA:   IV sedation  EBL:  minimal   BLOOD ADMINISTERED:none  DRAINS: none   LOCAL MEDICATIONS USED:  NONE  SPECIMEN:  No Specimen  DISPOSITION OF SPECIMEN:  N/A  COUNTS:  YES  TOURNIQUET:  * No tourniquets in log *  DICTATION: .Note written in EPIC  PLAN OF CARE: Discharge to home after PACU  PATIENT DISPOSITION:  PACU - hemodynamically stable.   Delay start of Pharmacological VTE agent (>24hrs) due to surgical blood loss or risk of bleeding: not applicable

## 2020-05-16 NOTE — Discharge Instructions (Signed)

## 2020-05-17 ENCOUNTER — Encounter (HOSPITAL_BASED_OUTPATIENT_CLINIC_OR_DEPARTMENT_OTHER): Payer: Self-pay | Admitting: Urology

## 2020-05-20 ENCOUNTER — Encounter: Payer: 59 | Attending: Surgery | Admitting: Skilled Nursing Facility1

## 2020-05-20 ENCOUNTER — Other Ambulatory Visit: Payer: Self-pay

## 2020-05-20 DIAGNOSIS — N189 Chronic kidney disease, unspecified: Secondary | ICD-10-CM | POA: Diagnosis present

## 2020-05-20 DIAGNOSIS — E669 Obesity, unspecified: Secondary | ICD-10-CM | POA: Insufficient documentation

## 2020-05-20 NOTE — Progress Notes (Signed)
Bariatric Nutrition Follow-Up Visit Medical Nutrition Therapy   2 Months Post-Operative sleeve Surgery Surgery Date: 03/25/2020  Pt's Expectations of Surgery/ Goals: to lose weight  Pt given star previously: no  NUTRITION ASSESSMENT    Anthropometrics  Surgery date: 03/25/2020 Surgery type: sleeve Start weight at [facility]: 237 Weight today: 204.6   Body Composition Scale 04/09/2020 05/20/2020  Total Body Fat % 40.9 38.6  Visceral Fat 12 11  Fat-Free Mass % 59 61.3   Total Body Water % 44 45.1   Muscle-Mass lbs 32 32.7  Body Fat Displacement           Torso  lbs 54.4 48.9         Left Leg  lbs 10.8 9.7         Right Leg  lbs 10.8 9.7         Left Arm  lbs 5.4 4.8         Right Arm   lbs 5.4 4.8    Clinical  Medical hx:  Medications:  Labs:    Lifestyle & Dietary Hx  Pt state she has had to deal with kidney stones needing to spend the night in the ED.  Pt states she does not like the taste of chewable multivitamin and cannot swallow the capsule.  Pt states she is interested in doing BELT. Pt states she wants half a rice cake as a snack with peanut butter.   Protein powder with 11  grams sugar and 8 grams fiber: advised to a void fruit in it currently.   Estimated daily fluid intake: 50.7 oz Estimated daily protein intake: 80+ g Supplements: chewable multi and calcium Current average weekly physical activity: walking dogs 1/2 mile  24-Hr Dietary Recall: 1-2 protein shake per day First Meal: protein shake + eggs  Snack: cheese or nuts Second Meal: low car wrap + mustard + cheese + [place] Snack: protein shake Third Meal: chicken breast + beans or meatballs or pork Snack: pork rinds or cheese or peanut butter Beverages: water, powerade zero  Post-Op Goals/ Signs/ Symptoms Using straws: no Drinking while eating: no Chewing/swallowing difficulties: no Changes in vision: no Changes to mood/headaches: no Hair loss/changes to skin/nails: no Difficulty  focusing/concentrating: no Sweating: no Dizziness/lightheadedness: no Palpitations: no  Carbonated/caffeinated beverages: no N/V/D/C/Gas: no Abdominal pain: no Dumping syndrome: no    NUTRITION DIAGNOSIS  Overweight/obesity ([place]ullen-3.3) related to past poor dietary habits and physical inactivity as evidenced by completed bariatric surgery and following dietary guidelines for continued weight loss and healthy nutrition status.     NUTRITION INTERVENTION Nutrition counseling (C-1) and education (E-2) to facilitate bariatric surgery goals, including: . Diet advancement to the next phase  . The importance of consuming adequate calories as well as certain nutrients daily due to the body's need for essential vitamins, minerals, and fats . The importance of daily physical activity and to reach a goal of at least 150 minutes of moderate to vigorous physical activity weekly (or as directed by their physician) due to benefits such as increased musculature and improved lab values . The importance of intuitive eating specifically learning hunger-satiety cues and understanding the importance of learning a new body: The importance of mindful eating to avoid grazing behaviors   Handouts Provided Include   Non starchy vegetables   Goals: Try the capsule multi later in the day or opened into yogurt (it will not mix)  Learning Style & Readiness for Change Teaching method utilized: Visual & Auditory  Demonstrated degree  of understanding via: Teach Back  Readiness Level: Action Barriers to learning/adherence to lifestyle change: none identified   RD's Notes for Next Visit . Assess adherence to pt chosen goals    MONITORING & EVALUATION Dietary intake, weekly physical activity, body weight  Next Steps Patient is to follow-up in 3 months

## 2020-08-13 ENCOUNTER — Ambulatory Visit: Payer: 59

## 2020-11-27 ENCOUNTER — Other Ambulatory Visit (HOSPITAL_COMMUNITY): Payer: Self-pay | Admitting: Family Medicine

## 2020-11-27 ENCOUNTER — Other Ambulatory Visit: Payer: Self-pay | Admitting: Family Medicine

## 2020-11-27 DIAGNOSIS — R110 Nausea: Secondary | ICD-10-CM

## 2020-12-05 ENCOUNTER — Ambulatory Visit (HOSPITAL_COMMUNITY)
Admission: RE | Admit: 2020-12-05 | Discharge: 2020-12-05 | Disposition: A | Discharge status: Home / Self Care | Payer: 59 | Source: Ambulatory Visit | Attending: Family Medicine | Admitting: Family Medicine

## 2020-12-05 ENCOUNTER — Other Ambulatory Visit: Payer: Self-pay

## 2020-12-05 DIAGNOSIS — R110 Nausea: Secondary | ICD-10-CM | POA: Insufficient documentation

## 2021-01-06 IMAGING — DX DG ABDOMEN 1V
1 series · 1 of 1 positions shown · non-contrast
Comparison: Abdominal radiograph April 15, 2020 and chest CT
abdomen pelvis April 12, 2020.

CLINICAL DATA: History of renal stones preop scan for lithotripsy
of left-sided renal stones

EXAM:
ABDOMEN - 1 VIEW

[abdomen kub]
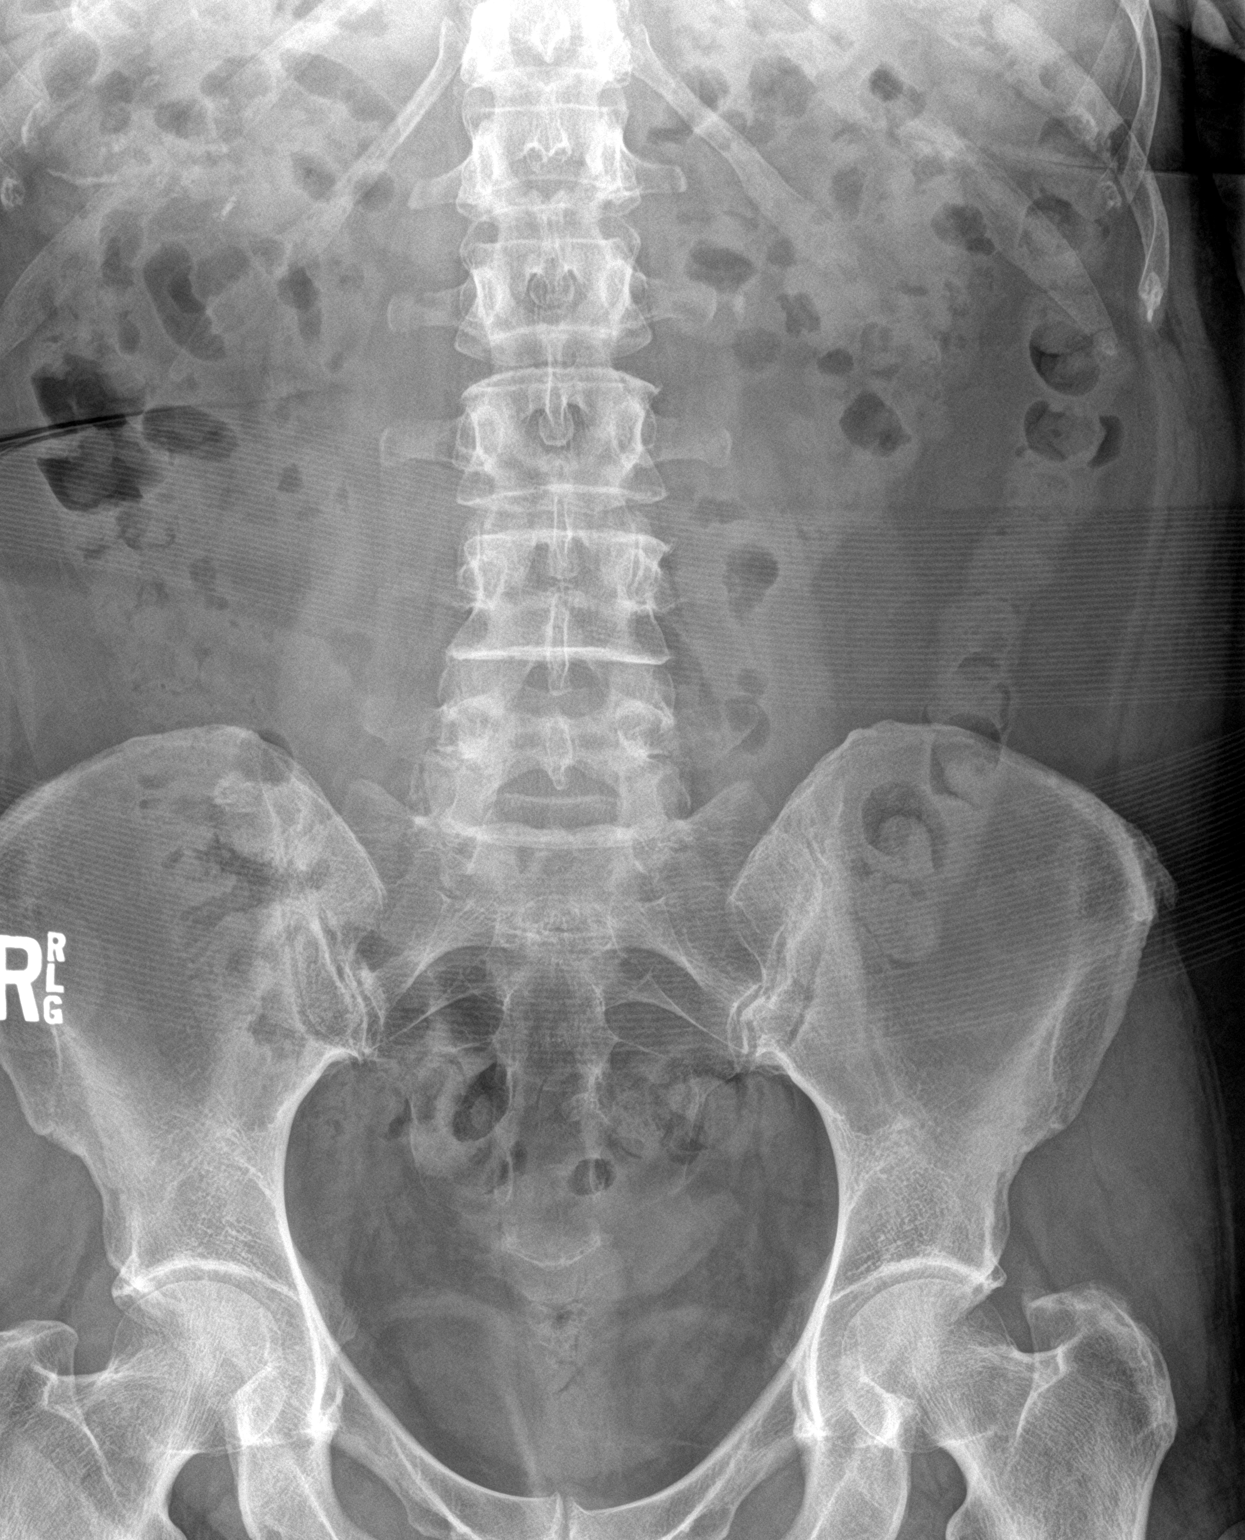

[1 of 1 positions shown; findings below may reference images not displayed]

FINDINGS: The bowel gas pattern is normal. Bilateral 5 mm radiopaque calcific
densities overlying the expected locations of the kidneys.
IMPRESSION: Bilateral 5 mm calculi overlying the expected locations of the
kidneys.

## 2021-01-09 ENCOUNTER — Encounter (HOSPITAL_COMMUNITY): Payer: Self-pay | Admitting: *Deleted

## 2021-07-01 ENCOUNTER — Other Ambulatory Visit: Payer: Self-pay | Admitting: Obstetrics and Gynecology

## 2021-07-01 DIAGNOSIS — Z1231 Encounter for screening mammogram for malignant neoplasm of breast: Secondary | ICD-10-CM

## 2021-07-08 ENCOUNTER — Ambulatory Visit
Admission: RE | Admit: 2021-07-08 | Discharge: 2021-07-08 | Disposition: A | Discharge status: Home / Self Care | Payer: 59 | Source: Ambulatory Visit | Attending: Obstetrics and Gynecology | Admitting: Obstetrics and Gynecology

## 2021-07-08 DIAGNOSIS — Z1231 Encounter for screening mammogram for malignant neoplasm of breast: Secondary | ICD-10-CM

## 2021-10-23 ENCOUNTER — Encounter (HOSPITAL_COMMUNITY): Payer: Self-pay | Admitting: *Deleted

## 2022-01-02 ENCOUNTER — Encounter: Payer: Self-pay | Admitting: Internal Medicine

## 2022-02-03 ENCOUNTER — Ambulatory Visit: Payer: 59 | Admitting: Internal Medicine

## 2022-02-03 ENCOUNTER — Encounter: Payer: Self-pay | Admitting: Internal Medicine

## 2022-02-03 VITALS — BP 112/72 | HR 76 | Ht 68.0 in | Wt 183.0 lb

## 2022-02-03 DIAGNOSIS — Z1211 Encounter for screening for malignant neoplasm of colon: Secondary | ICD-10-CM

## 2022-02-03 DIAGNOSIS — R079 Chest pain, unspecified: Secondary | ICD-10-CM | POA: Diagnosis not present

## 2022-02-03 DIAGNOSIS — R112 Nausea with vomiting, unspecified: Secondary | ICD-10-CM

## 2022-02-03 DIAGNOSIS — Z800 Family history of malignant neoplasm of digestive organs: Secondary | ICD-10-CM

## 2022-02-03 DIAGNOSIS — K219 Gastro-esophageal reflux disease without esophagitis: Secondary | ICD-10-CM | POA: Diagnosis not present

## 2022-02-03 NOTE — Patient Instructions (Signed)
Continue taking omeprazole twice daily.   You have been scheduled for an abdominal ultrasound at [facility] (1st floor of hospital) on 02/06/22 at 7:00am. Please arrive 15 minutes prior to your appointment for registration. Make certain not to have anything to eat or drink 6 hours prior to your appointment. Should you need to reschedule your appointment, please contact radiology at [phone]. This test typically takes about 30 minutes to perform.  We have sent a referral to [facility] and they will contact you with an appointment date and time.   After you have been cleared from cardiology, we will set you up your Upper Endoscopy and Colonoscopy with a nurse visit.  The [place] [facility] providers would like to encourage you to use [facility] to communicate with providers for non-urgent requests or questions.  Due to long hold times on the telephone, sending your provider a message by [facility] may be a faster and more efficient way to get a response.  Please allow 48 business hours for a response.  Please remember that this is for non-urgent requests.   Due to recent changes in healthcare laws, you may see the results of your imaging and laboratory studies on MyChart before your provider has had a chance to review them.  We understand that in some cases there may be results that are confusing or concerning to you. Not all laboratory results come back in the same time frame and the provider may be waiting for multiple results in order to interpret others.  Please give [place] 48 hours in order for your provider to thoroughly review all the results before contacting the office for clarification of your results.

## 2022-02-03 NOTE — Progress Notes (Signed)
HISTORY OF PRESENT ILLNESS:  Jill Cochran is a 51 y.o. 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  Patient tells me that she has had reflux disease for many years.  She reports the initial diagnosis with classic symptoms which resolved completely with PPI and returned with discontinuation of PPI.  The current problem is that of left chest pain that has occurred over the past several years.  Typically happens a few times per year and occurs at night, often while sleeping.  She describes the pain is severe.  May radiate to the jaw.  No associated shortness of breath.  She states that "feels like I am having a heart attack".  She does not experience breakthrough classic reflux symptoms with this chest pain.  She will take an acids with the chest pain, though they do not reliably help.  She told her PCP about this a few months ago and was told to increase her omeprazole from 40 mg once daily to 40 mg twice daily.  Despite this, she has had another attack.  The discomfort may last anywhere from 30 minutes or the entire night.  She has had vomiting of dry heaves on occasion.  She is status post sleeve gastrectomy with hiatal hernia repair October 2021 (Dr. Hassell Done).  She tells me that she has lost 80 pounds.  She states her reflux disease actually got better post surgery.  No dysphagia.  Her gallbladder is intact.  Review of outside radiology shows an abdominal ultrasound December 05, 2020.  Gallbladder did not show stones at that time.  Patient tells me that it has been greater than 10 years since she had a colonoscopy.  No lower GI complaints.  She is interested in follow-up screening colonoscopy.  There is no family history of colon cancer.  There is a history of esophageal cancer in his father diagnosed in his late 26s.  REVIEW OF SYSTEMS:  All non-GI ROS negative unless  otherwise stated in the HPI. Past Medical History:  Diagnosis Date   Chronic renal failure 2006 or 2008   Secondary to Right  Kidney  renal calculus , right kidney functiopns at 23 percent per pt   Complication of anesthesia    likes scopolamine patch   Depression    GERD (gastroesophageal reflux disease)    History of kidney stones    History of sleep apnea    never used cpap due to weight loss after surgery   Kidney stones    Peripheral vascular disease ([place])    leaky vein in Rt leg. is now resolved   PONV (postoperative nausea and vomiting)     Past Surgical History:  Procedure Laterality Date   BARIATRIC SURGERY     EXTRACORPOREAL SHOCK WAVE LITHOTRIPSY Left 05/16/2020   Procedure: EXTRACORPOREAL SHOCK WAVE LITHOTRIPSY (ESWL);  Surgeon: Remi Haggard, MD;  Location: [facility];  Service: Urology;  Laterality: Left;   KIDNEY SURGERY Right 2008   percutaneous nephrolithotomies   LAPAROSCOPIC GASTRIC SLEEVE RESECTION N/A 03/25/2020   Procedure: LAPAROSCOPIC GASTRIC SLEEVE RESECTION WITH HIATAL HERNIA REPAIR;  Surgeon: Johnathan Hausen, MD;  Location: [facility];  Service: General;  Laterality: N/A;   TONSILLECTOMY     as a child   UPPER GI ENDOSCOPY N/A 03/25/2020   Procedure: UPPER GI ENDOSCOPY;  Surgeon: Johnathan Hausen, MD;  Location: [facility];  Service: General;  Laterality: N/A;    Social History Kailyn L Battles  reports that she has never smoked. She has never used smokeless tobacco. She reports current alcohol use of about 2.0 standard drinks of alcohol per week. She reports that she does not use drugs.  family history includes Breast cancer in her paternal grandmother; Diabetes in her father and mother; Diabetes type I in her father; Esophageal cancer in her father; Heart disease in her father and mother.  Allergies  Allergen Reactions   Morphine And Related Nausea Only    Prefers not to take due to nausea   Ciprofloxacin Itching and Rash    Sulfamethoxazole Itching and Rash       PHYSICAL EXAMINATION: Vital signs: BP 112/72   Pulse 76   Ht '5\' 8"'$  (1.727 m)   Wt 183 lb (83 kg)   LMP 06/08/2010   BMI 27.83 kg/m   Constitutional: generally well-appearing, no acute distress Psychiatric: alert and oriented x3, cooperative Eyes: extraocular movements intact, anicteric, conjunctiva pink Mouth: oral pharynx moist, no lesions Neck: supple no lymphadenopathy Cardiovascular: heart regular rate and rhythm, no murmur Lungs: clear to auscultation bilaterally Abdomen: soft, nontender, nondistended, no obvious ascites, no peritoneal signs, normal bowel sounds, no organomegaly Rectal: Deferred until colonoscopy Extremities: no clubbing, cyanosis, or lower extremity edema bilaterally Skin: no lesions on visible extremities Neuro: No focal deficits.  Cranial is intact  ASSESSMENT:  1.  Recurrent chest pain as described.  Probably not acid reflux based on history, though she has reflux.  Could be biliary colic, though no stones on previous ultrasound.  Consider esophageal spasm.  Consider multiple non-GI etiologies.  Though the description of chest pain is somewhat atypical for cardiac disease, she should have a formal cardiac evaluation. 2.  Colon cancer screening.  Average risk 3.  Status post sleeve gastrectomy with hiatal hernia repair October 2021 4.  Family history of esophageal cancer in her father  PLAN:  1.  Continue omeprazole 40 mg twice daily (for now). 2.  Reflux precautions 3.  Schedule abdominal ultrasound to rule out gallstones 4.  Refer to cardiology for evaluation of chest pain. 5.  After cleared from cardiology, schedule upper endoscopy to evaluate chronic GERD (rule out Barrett's) and chest pain.The nature of the procedure, as well as the risks, benefits, and alternatives were carefully and thoroughly reviewed with the patient. Ample time for discussion and questions allowed. The patient understood, was satisfied,  and agreed to proceed.  6.  Schedule colonoscopy for colon cancer screening. The nature of the procedure, as well as the risks, benefits, and alternatives were carefully and thoroughly reviewed with the patient. Ample time for discussion and questions allowed. The patient understood, was satisfied, and agreed to proceed.  Can perform the procedures concurrently. 7.  Additional recommendations after the above A total time of 60 minutes was spent preparing to see the patient, reviewing outside records, obtaining comprehensive history, performing medically appropriate physical examination, counseling and educating the patient regarding the above listed issues, ordering advanced radiology, ordering multiple endoscopic procedures, and documenting clinical information in the health record

## 2022-02-06 ENCOUNTER — Ambulatory Visit (HOSPITAL_COMMUNITY)
Admission: RE | Admit: 2022-02-06 | Discharge: 2022-02-06 | Disposition: A | Discharge status: Home / Self Care | Payer: 59 | Source: Ambulatory Visit | Attending: Internal Medicine | Admitting: Internal Medicine

## 2022-02-06 DIAGNOSIS — R112 Nausea with vomiting, unspecified: Secondary | ICD-10-CM

## 2022-02-06 DIAGNOSIS — K219 Gastro-esophageal reflux disease without esophagitis: Secondary | ICD-10-CM | POA: Diagnosis present

## 2022-02-12 ENCOUNTER — Ambulatory Visit: Payer: 59 | Attending: Internal Medicine | Admitting: Internal Medicine

## 2022-02-12 VITALS — BP 118/80 | HR 82 | Ht 67.0 in | Wt 183.6 lb

## 2022-02-12 DIAGNOSIS — R072 Precordial pain: Secondary | ICD-10-CM

## 2022-02-12 DIAGNOSIS — I10 Essential (primary) hypertension: Secondary | ICD-10-CM

## 2022-02-12 MED ORDER — METOPROLOL TARTRATE 100 MG PO TABS: 100.0000 mg | ORAL_TABLET | Freq: Once | ORAL | 0 refills | Status: AC

## 2022-02-12 NOTE — Progress Notes (Signed)
Cardiology Office Note:    Date:  02/12/2022   ID:  Jill Cochran, DOB 1971-01-24, MRN [id number]  PCP:  Kathyrn Lass, MD  Cardiologist:  Elouise Munroe, MD  Electrophysiologist:  None   Referring MD: Kathyrn Lass, MD   Chief Complaint/Reason for Referral: Chest pain  History of Present Illness:    Jill Cochran is a 51 y.o. female with a history of HTN and renal dysfunction due to stones, GERD.   Chest pain episdoes - a few times a year. Woke up feeling pressure on chest/pain - "enough to make you cry." Substernal pressure, non radiating. Takes GI antacids - some relief but not complete. Nausea and hives recently.  2 CP episodes in last year and 4 total.   No smoking, rare etoh, 2 glass wine/mo. 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  No formal exercise. No SOB.  No trouble with stairs, no exertional sx.   Fhx: MGM - MIs often in 33s.   The patient denies dyspnea at rest or with exertion, palpitations, PND, orthopnea, or leg swelling. Denies cough, fever, chills. Denies nausea, vomiting. Denies syncope or presyncope. Denies dizziness or lightheadedness.   Past Medical History:  Diagnosis Date   Chronic renal failure 2006 or 2008   Secondary to Right  Kidney  renal calculus , right kidney functiopns at 23 percent per pt   Complication of anesthesia    likes scopolamine patch   Depression    GERD (gastroesophageal reflux disease)    History of kidney stones    History of sleep apnea    never used cpap due to weight loss after surgery   Kidney stones    Peripheral vascular disease ([place])    leaky vein in Rt leg. is now resolved   PONV (postoperative nausea and vomiting)     Past Surgical History:  Procedure Laterality Date   BARIATRIC SURGERY     EXTRACORPOREAL SHOCK WAVE LITHOTRIPSY Left 05/16/2020   Procedure: EXTRACORPOREAL SHOCK WAVE LITHOTRIPSY (ESWL);  Surgeon: Remi Haggard, MD;  [facility];  Service:  Urology;  Laterality: Left;   KIDNEY SURGERY Right 2008   percutaneous nephrolithotomies   LAPAROSCOPIC GASTRIC SLEEVE RESECTION N/A 03/25/2020   Procedure: LAPAROSCOPIC GASTRIC SLEEVE RESECTION WITH HIATAL HERNIA REPAIR;  Surgeon: Johnathan Hausen, MD;  Location: [facility];  Service: General;  Laterality: N/A;   TONSILLECTOMY     as a child   UPPER GI ENDOSCOPY N/A 03/25/2020   Procedure: UPPER GI ENDOSCOPY;  Surgeon: Johnathan Hausen, MD;  Location: [facility];  Service: General;  Laterality: N/A;    Current Medications: Current Meds  Medication Sig   ALPRAZolam (XANAX) 0.25 MG tablet Take 0.25 mg by mouth daily as needed for anxiety.    Cholecalciferol (VITAMIN D) 50 MCG (2000 UT) CAPS Take 2,000 Units by mouth daily.   indapamide (LOZOL) 2.5 MG tablet Take 2.5 mg by mouth daily.   metoprolol tartrate (LOPRESSOR) 100 MG tablet Take 1 tablet (100 mg total) by mouth once for 1 dose. PLEASE TAKE METOPROLOL 2  HOURS PRIOR TO CTA SCAN.   Multiple Vitamin (MULTIVITAMIN WITH MINERALS) TABS tablet Take 1 tablet by mouth daily.   omeprazole (PRILOSEC) 40 MG capsule Take 40 mg by mouth daily.     Allergies:   Morphine and related, Ciprofloxacin, and Sulfamethoxazole   Social History   Tobacco Use   Smoking status: Never   Smokeless tobacco: Never  Vaping Use  Vaping Use: Never used  Substance Use Topics   Alcohol use: Yes    Alcohol/week: 2.0 standard drinks of alcohol    Types: 2 Standard drinks or equivalent per week    Comment: ocassionally   Drug use: No     Family History: The patient's family history includes Breast cancer in her paternal grandmother; Diabetes in her father and mother; Diabetes type I in her father; Esophageal cancer in her father; Heart disease in her father and mother. There is no history of Stomach cancer or Colon cancer.  ROS:   Please see the history of present illness.    All other systems reviewed and are negative.  EKGs/Labs/Other Studies Reviewed:     The following studies were reviewed today:  EKG:  NSR, poor r wave progression.   Imaging studies that I have independently reviewed today: n/a  Recent Labs: 02/12/2022: BUN 22; Creatinine, Ser 1.01; Potassium 4.1; Sodium 143  Recent Lipid Panel No results found for: "CHOL", "TRIG", "HDL", "CHOLHDL", "VLDL", "LDLCALC", "LDLDIRECT"  Physical Exam:    VS:  BP 118/80   Pulse 82   Ht '5\' 7"'$  (1.702 m)   Wt 183 lb 9.6 oz (83.3 kg)   LMP 06/08/2010   SpO2 98%   BMI 28.76 kg/m     Wt Readings from Last 5 Encounters:  02/12/22 183 lb 9.6 oz (83.3 kg)  02/03/22 183 lb (83 kg)  05/20/20 204 lb 9.6 oz (92.8 kg)  05/16/20 203 lb (92.1 kg)  04/12/20 215 lb (97.5 kg)    Constitutional: No acute distress Eyes: sclera non-icteric, normal conjunctiva and lids ENMT: normal dentition, moist mucous membranes Cardiovascular: regular rhythm, normal rate, no murmur. S1 and S2 normal. No jugular venous distention.  Respiratory: clear to auscultation bilaterally GI : normal bowel sounds, soft and nontender. No distention.   MSK: extremities warm, well perfused. No edema.  NEURO: grossly nonfocal exam, moves all extremities. PSYCH: alert and oriented x 3, normal mood and affect.   ASSESSMENT:    1. Precordial pain   2. Primary hypertension    PLAN:    Precordial pain - Plan: EKG 12-Lead, CT CORONARY MORPH W/CTA COR W/SCORE W/CA W/CM &/OR WO/CM, Basic metabolic panel  Primary hypertension  - discussed that chest pain sounds possibly cardiac and we should evaluate with CCTA. HR control to be provided. If no obstructive CAD, may represent small vessel vs noncardiac/GI origin of pain. - continue indapamide for now, good blood pressure control.   Total time of encounter: 40 minutes total time of encounter, including 25 minutes spent in face-to-face patient care on the date of this encounter. This time includes coordination of care and counseling regarding above mentioned problem list.  Remainder of non-face-to-face time involved reviewing chart documents/testing relevant to the patient encounter and documentation in the medical record. I have independently reviewed documentation from referring provider.   Cherlynn Kaiser, MD, [place]   Shared Decision Making/Informed Consent:       Medication Adjustments/Labs and Tests Ordered: Current medicines are reviewed at length with the patient today.  Concerns regarding medicines are outlined above.   Orders Placed This Encounter  Procedures   CT CORONARY MORPH W/CTA COR W/SCORE W/CA W/CM &/OR WO/CM   Basic metabolic panel   EKG [id number]    Meds ordered this encounter  Medications   metoprolol tartrate (LOPRESSOR) 100 MG tablet    Sig: Take 1 tablet (100 mg total) by mouth once for 1 dose.  PLEASE TAKE METOPROLOL 2  HOURS PRIOR TO CTA SCAN.    Dispense:  1 tablet    Refill:  0    Patient Instructions  Medication Instructions:  PLEASE TAKE METOPROLOL TARTRATE '100mg'$  TWO HOURS PRIOR TO CCTA SCAN  *If you need a refill on your cardiac medications before your next appointment, please call your pharmacy*  Lab Work: BLOOD WORK TODAY  If you have labs (blood work) drawn today and your tests are completely normal, you will receive your results only by: [place] (if you have MyChart) OR A paper copy in the mail If you have any lab test that is abnormal or we need to change your treatment, we will call you to review the results.  Testing/Procedures: Your physician has requested that you have cardiac CT. Cardiac computed tomography (CT) is a painless test that uses an x-ray machine to take clear, detailed pictures of your heart. For further information please visit [email or web address].[email or web address]. Please follow instruction sheet as given.   Follow-Up: At [facility], you and your health needs are our priority.  As part of our continuing mission to provide you with exceptional heart care, we have  created designated Provider Care Teams.  These Care Teams include your primary Cardiologist (physician) and Advanced Practice Providers (APPs -  Physician Assistants and Nurse Practitioners) who all work together to provide you with the care you need, when you need it.   Your next appointment:   AS NEEDED PENDING TEST RESULTS   The format for your next appointment:   In Person  Provider:   Elouise Munroe, MD     Other Instructions   Your cardiac CT will be scheduled at one of the below locations:   [facility] [address] [place], [place] [address] [phone]  If scheduled at [facility], please arrive at the [facility] and Children's Entrance (Entrance C2) of [facility] 30 minutes prior to test start time. You can use the FREE valet parking offered at entrance C (encouraged to control the heart rate for the test)  Proceed to the [facility] Radiology Department (first floor) to check-in and test prep.  All radiology patients and guests should use entrance C2 at [facility], accessed from [facility], even though the hospital's physical address listed is [address]    Please follow these instructions carefully (unless otherwise directed):  On the Night Before the Test: Be sure to Drink plenty of water. Do not consume any caffeinated/decaffeinated beverages or chocolate 12 hours prior to your test. Do not take any antihistamines 12 hours prior to your test.  On the Day of the Test: Drink plenty of water until 1 hour prior to the test. Do not eat any food 4 hours prior to the test. You may take your regular medications prior to the test.  Take metoprolol (Lopressor) two hours prior to test. HOLD Furosemide/Hydrochlorothiazide morning of the test. FEMALES- please wear underwire-free bra if available, avoid dresses & tight clothing      After the Test: Drink plenty of water. After receiving IV contrast, you  may experience a mild flushed feeling. This is normal. On occasion, you may experience a mild rash up to 24 hours after the test. This is not dangerous. If this occurs, you can take Benadryl 25 mg and increase your fluid intake. If you experience trouble breathing, this can be serious. If it is severe call 911 IMMEDIATELY. If it is mild,  please call our office. If you take any of these medications: Glipizide/Metformin, Avandament, Glucavance, please do not take 48 hours after completing test unless otherwise instructed.  We will call to schedule your test 2-4 weeks out understanding that some insurance companies will need an authorization prior to the service being performed.   For non-scheduling related questions, please contact the cardiac imaging nurse navigator should you have any questions/concerns: Marchia Bond, Cardiac Imaging Nurse Navigator Gordy Clement, Cardiac Imaging Nurse Navigator [facility] Direct Office Dial: [phone]   For scheduling needs, including cancellations and rescheduling, please call [place], [phone].

## 2022-02-12 NOTE — Patient Instructions (Signed)
Medication Instructions:  PLEASE TAKE METOPROLOL TARTRATE '100mg'$  TWO HOURS PRIOR TO CCTA SCAN  *If you need a refill on your cardiac medications before your next appointment, please call your pharmacy*  Lab Work: BLOOD WORK TODAY  If you have labs (blood work) drawn today and your tests are completely normal, you will receive your results only by: [place] (if you have MyChart) OR A paper copy in the mail If you have any lab test that is abnormal or we need to change your treatment, we will call you to review the results.  Testing/Procedures: Your physician has requested that you have cardiac CT. Cardiac computed tomography (CT) is a painless test that uses an x-ray machine to take clear, detailed pictures of your heart. For further information please visit [email or web address][facility][email or web address]. Please follow instruction sheet as given.   Follow-Up: At [facility], you and your health needs are our priority.  As part of our continuing mission to provide you with exceptional heart care, we have created designated Provider Care Teams.  These Care Teams include your primary Cardiologist (physician) and Advanced Practice Providers (APPs -  Physician Assistants and Nurse Practitioners) who all work together to provide you with the care you need, when you need it.   Your next appointment:   AS NEEDED PENDING TEST RESULTS   The format for your next appointment:   In Person  Provider:   Elouise Munroe, MD     Other Instructions   Your cardiac CT will be scheduled at one of the below locations:   [facility] [address] [place], [place] [address] [phone]  If scheduled at [facility], please arrive at the [facility] and Children's Entrance (Entrance C2) of [facility] 30 minutes prior to test start time. You can use the FREE valet parking offered at entrance C (encouraged to control the heart rate for the test)  Proceed to the [facility] Radiology  Department (first floor) to check-in and test prep.  All radiology patients and guests should use entrance C2 at [facility], accessed from [facility], even though the hospital's physical address listed is [address]    Please follow these instructions carefully (unless otherwise directed):  On the Night Before the Test: Be sure to Drink plenty of water. Do not consume any caffeinated/decaffeinated beverages or chocolate 12 hours prior to your test. Do not take any antihistamines 12 hours prior to your test.  On the Day of the Test: Drink plenty of water until 1 hour prior to the test. Do not eat any food 4 hours prior to the test. You may take your regular medications prior to the test.  Take metoprolol (Lopressor) two hours prior to test. HOLD Furosemide/Hydrochlorothiazide morning of the test. FEMALES- please wear underwire-free bra if available, avoid dresses & tight clothing      After the Test: Drink plenty of water. After receiving IV contrast, you may experience a mild flushed feeling. This is normal. On occasion, you may experience a mild rash up to 24 hours after the test. This is not dangerous. If this occurs, you can take Benadryl 25 mg and increase your fluid intake. If you experience trouble breathing, this can be serious. If it is severe call 911 IMMEDIATELY. If it is mild, please call our office. If you take any of these medications: Glipizide/Metformin, Avandament, Glucavance, please do not take 48 hours after completing test unless otherwise instructed.  We will call  to schedule your test 2-4 weeks out understanding that some insurance companies will need an authorization prior to the service being performed.   For non-scheduling related questions, please contact the cardiac imaging nurse navigator should you have any questions/concerns: Marchia Bond, Cardiac Imaging Nurse Navigator Gordy Clement, Cardiac Imaging Nurse  Navigator [facility] Direct Office Dial: [phone]   For scheduling needs, including cancellations and rescheduling, please call [place], [phone].

## 2022-02-13 LAB — BASIC METABOLIC PANEL
BUN/Creatinine Ratio: 22 (ref 9–23)
BUN: 22 mg/dL (ref 6–24)
CO2: 25 mmol/L (ref 20–29)
Calcium: 9.7 mg/dL (ref 8.7–10.2)
Chloride: 102 mmol/L (ref 96–106)
Creatinine, Ser: 1.01 mg/dL — ABNORMAL HIGH (ref 0.57–1.00)
Glucose: 75 mg/dL (ref 70–99)
Potassium: 4.1 mmol/L (ref 3.5–5.2)
Sodium: 143 mmol/L (ref 134–144)
eGFR: 67 mL/min/{1.73_m2} (ref >59)

## 2022-02-28 IMAGING — MG MM DIGITAL SCREENING BILAT W/ TOMO AND CAD
8 series · 8 of 24 positions shown · non-contrast
Comparison: Previous exam(s).

CLINICAL DATA: Screening.

EXAM:
DIGITAL SCREENING BILATERAL MAMMOGRAM WITH TOMOSYNTHESIS AND CAD
TECHNIQUE: Bilateral screening digital craniocaudal and mediolateral oblique
mammograms were obtained. Bilateral screening digital breast
tomosynthesis was performed. The images were evaluated with
computer-aided detection.

[R CC synth-2D]
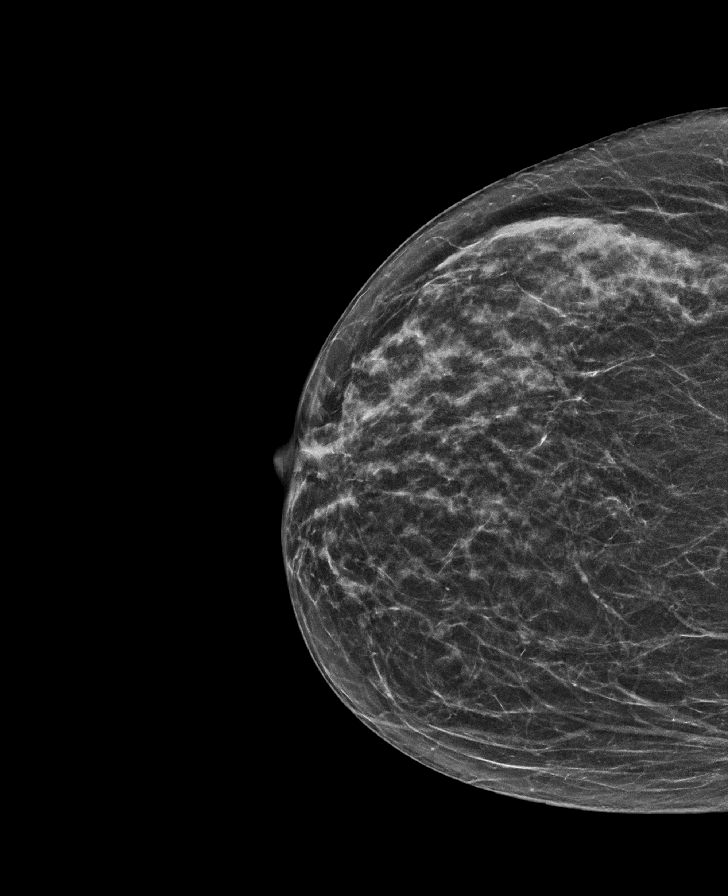

[L CC synth-2D]
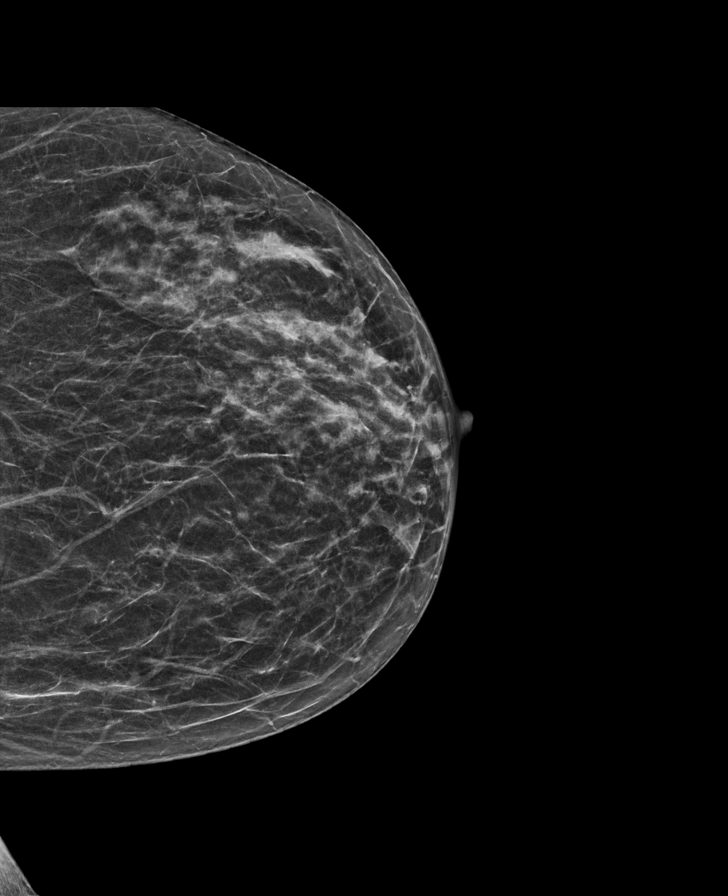

[L MLO synth-2D]
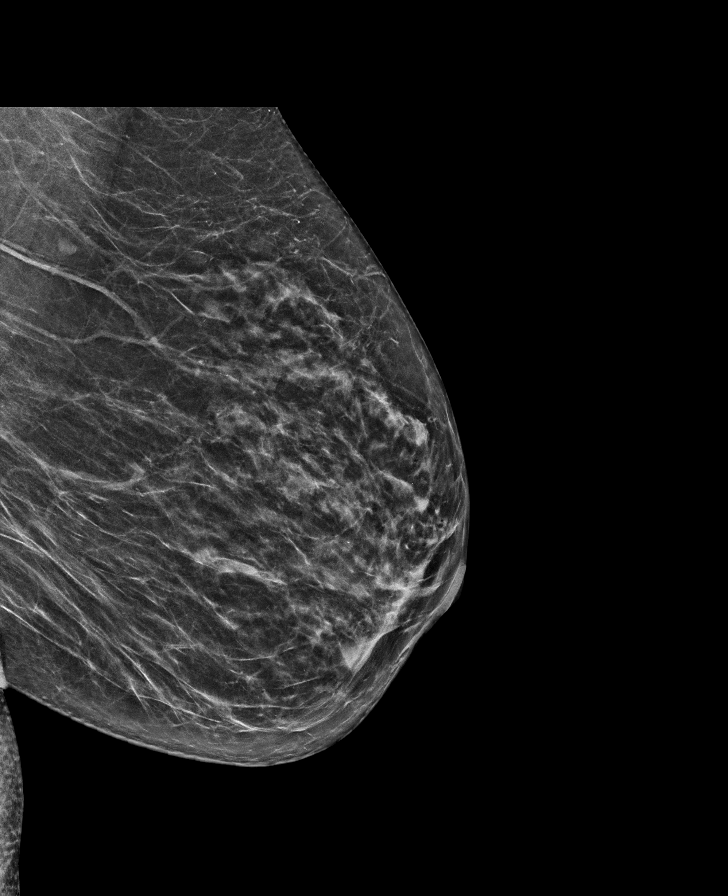

[R MLO synth-2D]
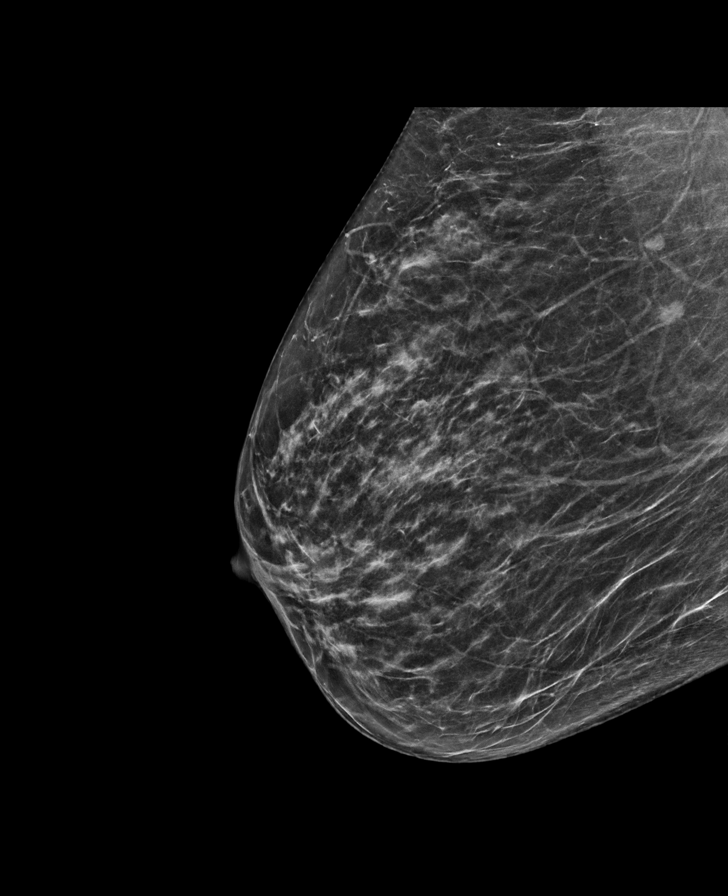

[L CC tomo · tomo slice 27/53.0]
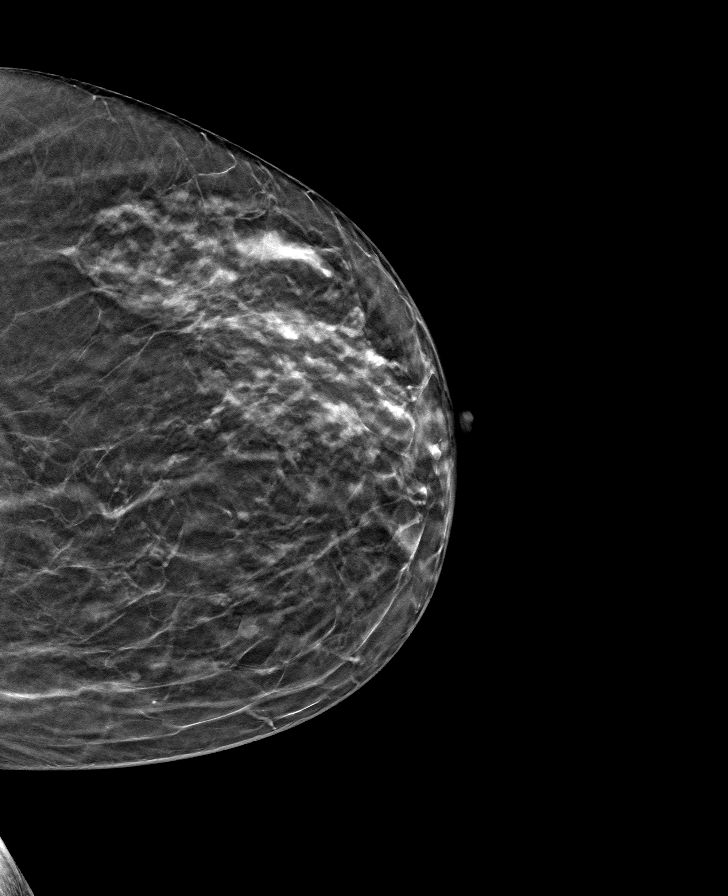

[R CC tomo · tomo slice 27/53.0]
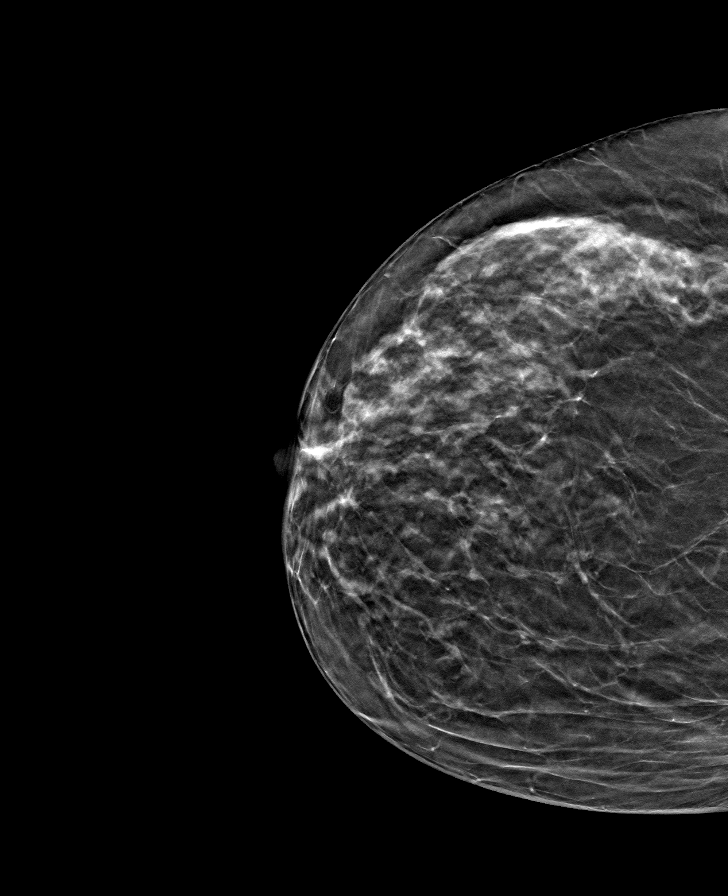

[L MLO tomo · tomo slice 31/61.0]
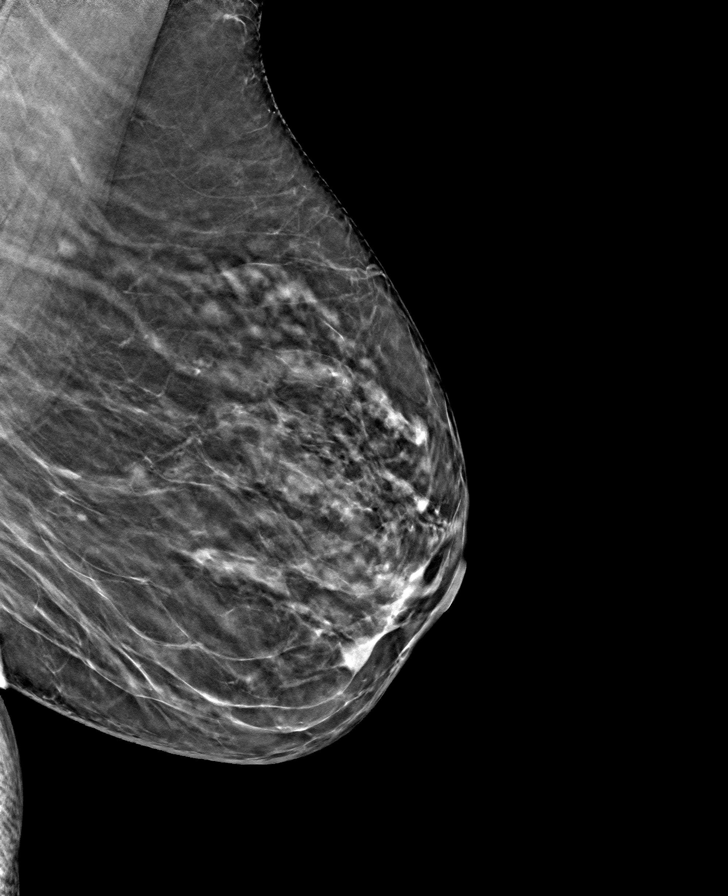

[R MLO tomo · tomo slice 31/62.0]
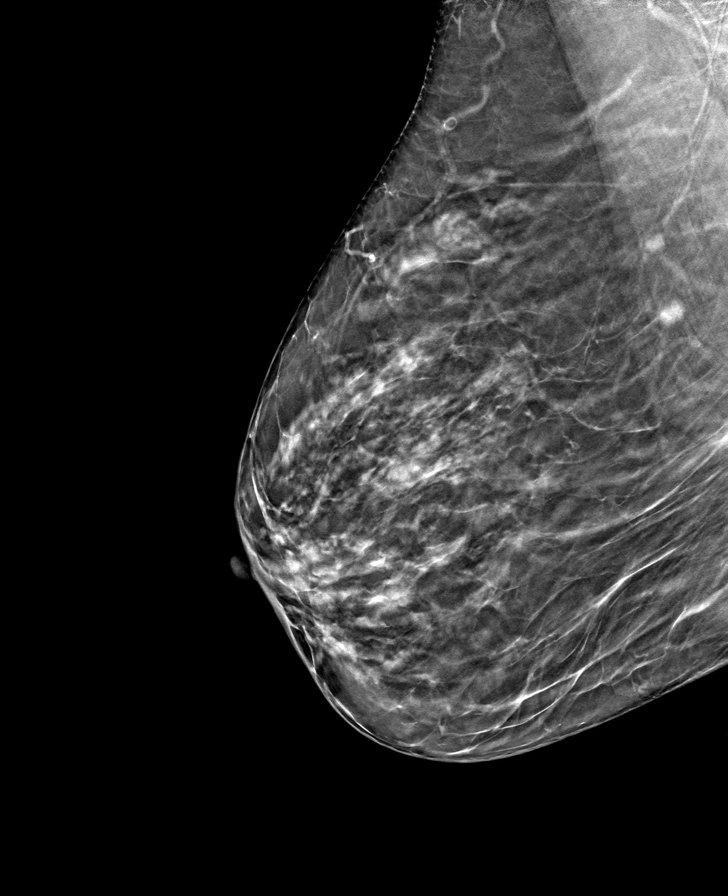

[8 of 24 positions shown; findings below may reference images not displayed]

ACR Breast Density Category b: There are scattered areas of
fibroglandular density.
FINDINGS: There are no findings suspicious for malignancy.
IMPRESSION: No mammographic evidence of malignancy. A result letter of this
screening mammogram will be mailed directly to the patient.

RECOMMENDATION:
Screening mammogram in one year. (Code:51-O-LD2)

BI-RADS CATEGORY  1: Negative.

## 2022-03-02 ENCOUNTER — Telehealth (HOSPITAL_COMMUNITY): Payer: Self-pay | Admitting: *Deleted

## 2022-03-02 NOTE — Telephone Encounter (Signed)
Reaching out to patient to offer assistance regarding upcoming cardiac imaging study; pt verbalizes understanding of appt date/time, parking situation and where to check in, medications ordered, and verified current allergies; name and call back number provided for further questions should they arise  Gordy Clement RN Navigator Cardiac Imaging [facility] [phone] office [phone] cell  Patient to take '100mg'$  metoprolol tartrate two hours prior to her cardiac CT scan. She is aware to arrive at 8am.

## 2022-03-03 ENCOUNTER — Ambulatory Visit (HOSPITAL_COMMUNITY)
Admission: RE | Admit: 2022-03-03 | Discharge: 2022-03-03 | Disposition: A | Discharge status: Home / Self Care | Payer: 59 | Source: Ambulatory Visit | Attending: Internal Medicine | Admitting: Internal Medicine

## 2022-03-03 ENCOUNTER — Encounter (HOSPITAL_COMMUNITY): Payer: Self-pay

## 2022-03-03 DIAGNOSIS — R072 Precordial pain: Secondary | ICD-10-CM | POA: Insufficient documentation

## 2022-03-03 MED ORDER — SODIUM CHLORIDE 0.9 % IV BOLUS
500.0000 mL | Freq: Once | INTRAVENOUS | Status: AC
  Administered 2022-03-03: 500 mL via INTRAVENOUS

## 2022-03-03 MED ORDER — IOHEXOL 350 MG/ML SOLN
100.0000 mL | Freq: Once | INTRAVENOUS | Status: AC | PRN
  Administered 2022-03-03: 100 mL via INTRAVENOUS

## 2022-03-03 MED ORDER — NITROGLYCERIN 0.4 MG SL SUBL
0.8000 mg | SUBLINGUAL_TABLET | Freq: Once | SUBLINGUAL | Status: AC
  Administered 2022-03-03: 0.8 mg via SUBLINGUAL

## 2022-03-03 MED ORDER — NITROGLYCERIN 0.4 MG SL SUBL
SUBLINGUAL_TABLET | SUBLINGUAL | Status: AC
  Filled 2022-03-03: qty 2

## 2022-03-05 ENCOUNTER — Encounter: Payer: Self-pay | Admitting: Internal Medicine

## 2022-03-05 ENCOUNTER — Telehealth: Payer: Self-pay | Admitting: Internal Medicine

## 2022-03-05 ENCOUNTER — Telehealth: Payer: Self-pay

## 2022-03-05 NOTE — Telephone Encounter (Signed)
[facility] Pre-operative Risk Assessment     Request for surgical clearance:     Endoscopy Procedure  What type of surgery is being performed?     Endoscopy/colonoscopy  When is this surgery scheduled?     Awaiting clearance prior to scheduling  What type of clearance is required ?   Surgical  Are there any medications that need to be held prior to surgery and how long?   Practice name and name of physician performing surgery?      [facility]  What is your office phone and fax number?      Phone- [phone]  [phone]  Anesthesia type (None, local, MAC, general) ?       MAC

## 2022-03-05 NOTE — Telephone Encounter (Signed)
Called pt to go over her CT results.  Pt requests results be faxed to Dr. Henrene Pastor, her GI doctor.  Called Dr. Blanch Media office to get the fax number.

## 2022-03-05 NOTE — Telephone Encounter (Signed)
   Patient Name: Jill Cochran  DOB: 1971-01-05 MRN: [id number]  Primary Cardiologist: Elouise Munroe, MD  Chart reviewed as part of pre-operative protocol coverage. Given past medical history and time since last visit, based on ACC/AHA guidelines, Jill Cochran would be at acceptable risk for the planned procedure without further cardiovascular testing.   The patient was advised that if she develops new symptoms prior to surgery to contact our office to arrange for a follow-up visit, and she verbalized understanding.  I will route this recommendation to the requesting party via Epic fax function and remove from pre-op pool.  Please call with questions.  Lenna Sciara, NP 03/05/2022, 4:10 PM

## 2022-03-05 NOTE — Telephone Encounter (Signed)
Follow Up:    Patient says she needs someone to please call and explain to her, her CT results.She said she saw it, but did not understand it.

## 2022-03-05 NOTE — Telephone Encounter (Signed)
CT results faxed via Epic

## 2022-03-06 ENCOUNTER — Telehealth: Payer: Self-pay

## 2022-03-06 NOTE — Telephone Encounter (Signed)
-----   Message from Irene Shipper, MD sent at 03/05/2022  4:22 PM EDT ----- Regarding: Cardiac clearance Vaughan Basta,  Please let the patient know that I have reviewed her cardiac CT and received cardiac clearance from the cardiology team to proceed with her endoscopic procedures.  Please set up colonoscopy and upper endoscopy in the [place].  Refer to my office note if necessary.  Thanks,  Dr. Henrene Pastor   ----- Message ----- From: Orvan July, RN Sent: 03/05/2022   3:25 PM EDT To: Irene Shipper, MD

## 2022-03-06 NOTE — Telephone Encounter (Signed)
Left message for pt to call back to schedule ECL.

## 2022-03-10 NOTE — Telephone Encounter (Unsigned)
Mychart message sent to pt requesting she call the office to schedule ECL.  Pt scheduled for Endo/colon in the [place] 03/25/22 at 3:30pm. Amb ref in epic. Script for prep sent to pharmacy, instructions sent to pt via mychart. Pt aware of appt.

## 2022-03-11 ENCOUNTER — Other Ambulatory Visit: Payer: Self-pay

## 2022-03-11 DIAGNOSIS — Z1211 Encounter for screening for malignant neoplasm of colon: Secondary | ICD-10-CM

## 2022-03-11 DIAGNOSIS — R112 Nausea with vomiting, unspecified: Secondary | ICD-10-CM

## 2022-03-11 DIAGNOSIS — K219 Gastro-esophageal reflux disease without esophagitis: Secondary | ICD-10-CM

## 2022-03-11 MED ORDER — NA SULFATE-K SULFATE-MG SULF 17.5-3.13-1.6 GM/177ML PO SOLN: ORAL | 0 refills | Status: DC

## 2022-03-23 ENCOUNTER — Telehealth: Payer: Self-pay

## 2022-03-23 NOTE — Telephone Encounter (Signed)
Sent info regarding possible reimbursement for prep to PA department

## 2022-03-25 ENCOUNTER — Encounter: Payer: Self-pay | Admitting: Internal Medicine

## 2022-03-25 ENCOUNTER — Ambulatory Visit (AMBULATORY_SURGERY_CENTER): Payer: 59 | Admitting: Internal Medicine

## 2022-03-25 VITALS — BP 109/69 | HR 73 | Temp 97.8°F | Resp 14 | Ht 68.0 in | Wt 183.0 lb

## 2022-03-25 DIAGNOSIS — Z800 Family history of malignant neoplasm of digestive organs: Secondary | ICD-10-CM

## 2022-03-25 DIAGNOSIS — K219 Gastro-esophageal reflux disease without esophagitis: Secondary | ICD-10-CM | POA: Diagnosis present

## 2022-03-25 DIAGNOSIS — Z1211 Encounter for screening for malignant neoplasm of colon: Secondary | ICD-10-CM

## 2022-03-25 DIAGNOSIS — R079 Chest pain, unspecified: Secondary | ICD-10-CM | POA: Diagnosis not present

## 2022-03-25 DIAGNOSIS — R112 Nausea with vomiting, unspecified: Secondary | ICD-10-CM

## 2022-03-25 MED ORDER — OMEPRAZOLE 40 MG PO CPDR: 40.0000 mg | DELAYED_RELEASE_CAPSULE | Freq: Every day | ORAL | 3 refills | Status: DC

## 2022-03-25 MED ORDER — SODIUM CHLORIDE 0.9 % IV SOLN: 500.0000 mL | Freq: Once | INTRAVENOUS | Status: AC

## 2022-03-25 NOTE — Progress Notes (Signed)
Pt's states no medical or surgical changes since previsit or office visit. 

## 2022-03-25 NOTE — Op Note (Signed)
[place] Patient Name: Jill Cochran Procedure Date: 03/25/2022 3:18 PM MRN: [id number] Endoscopist: Docia Chuck. Henrene Pastor , MD Age: 51 Referring MD:  Date of Birth: [birth year] Gender: Female Account #: [id number] Procedure:                Upper GI endoscopy Indications:              Esophageal reflux, Chest pain (non cardiac) Medicines:                Monitored Anesthesia Care Procedure:                Pre-Anesthesia Assessment:                           - Prior to the procedure, a History and Physical                            was performed, and patient medications and                            allergies were reviewed. The patient's tolerance of                            previous anesthesia was also reviewed. The risks                            and benefits of the procedure and the sedation                            options and risks were discussed with the patient.                            All questions were answered, and informed consent                            was obtained. Prior Anticoagulants: The patient has                            taken no previous anticoagulant or antiplatelet                            agents. ASA Grade Assessment: II - A patient with                            mild systemic disease. After reviewing the risks                            and benefits, the patient was deemed in                            satisfactory condition to undergo the procedure.                           After obtaining informed consent, the endoscope was  passed under direct vision. Throughout the                            procedure, the patient's blood pressure, pulse, and                            oxygen saturations were monitored continuously. The                            Endoscope was introduced through the mouth, and                            advanced to the second part of duodenum. The upper                            GI endoscopy  was accomplished without difficulty.                            The patient tolerated the procedure well. Scope In: Scope Out: Findings:                 The esophagus was normal.                           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 The postoperative anatomy was                            unremarkable without any complicating features or                            mucosal abnormalities.                           The examined duodenum was normal.                           The cardia and gastric fundus were normal on                            retroflexion. Complications:            No immediate complications. Estimated Blood Loss:     Estimated blood loss: none. Impression:               1. Unremarkable anatomy, status post sleeve                            gastrectomy.                           2. GERD                           3. Chest pain. Improved since office evaluation.  Negative cardiac work-up. Negative ultrasound. Recommendation:           1. Patient has a contact number available for                            emergencies. The signs and symptoms of potential                            delayed complications were discussed with the                            patient. Return to normal activities tomorrow.                            Written discharge instructions were provided to the                            patient.                           2. Resume previous diet.                           3. Decrease omeprazole to 40 mg once daily. I would                            recommend staying on this dose to avoid recurrent                            reflux symptoms. If symptoms recur on once daily                            dose, return to twice daily dosage.                           4. Return to the care of your primary provider. GI                            follow-up as needed                            . Docia Chuck. Henrene Pastor, MD 03/25/2022 4:00:09 PM This report has been signed electronically.

## 2022-03-25 NOTE — Progress Notes (Signed)
HISTORY OF PRESENT ILLNESS:   Jill Cochran is a 51 y.o. 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  Patient tells me that she has had reflux disease for many years.  She reports the initial diagnosis with classic symptoms which resolved completely with PPI and returned with discontinuation of PPI.  The current problem is that of left chest pain that has occurred over the past several years.  Typically happens a few times per year and occurs at night, often while sleeping.  She describes the pain is severe.  May radiate to the jaw.  No associated shortness of breath.  She states that "feels like I am having a heart attack".  She does not experience breakthrough classic reflux symptoms with this chest pain.  She will take an acids with the chest pain, though they do not reliably help.  She told her PCP about this a few months ago and was told to increase her omeprazole from 40 mg once daily to 40 mg twice daily.  Despite this, she has had another attack.  The discomfort may last anywhere from 30 minutes or the entire night.  She has had vomiting of dry heaves on occasion.  She is status post sleeve gastrectomy with hiatal hernia repair October 2021 (Dr. Hassell Done).  She tells me that she has lost 80 pounds.  She states her reflux disease actually got better post surgery.  No dysphagia.  Her gallbladder is intact.  Review of outside radiology shows an abdominal ultrasound December 05, 2020.  Gallbladder did not show stones at that time.  Patient tells me that it has been greater than 10 years since she had a colonoscopy.  No lower GI complaints.  She is interested in follow-up screening colonoscopy.  There is no family history of colon cancer.  There is a history of esophageal cancer in his father diagnosed in his late 10s.   REVIEW OF SYSTEMS:   All non-GI ROS negative  unless otherwise stated in the HPI.     Past Medical History:  Diagnosis Date   Chronic renal failure 2006 or 2008    Secondary to Right  Kidney  renal calculus , right kidney functiopns at 23 percent per pt   Complication of anesthesia      likes scopolamine patch   Depression     GERD (gastroesophageal reflux disease)     History of kidney stones     History of sleep apnea      never used cpap due to weight loss after surgery   Kidney stones     Peripheral vascular disease ([place])      leaky vein in Rt leg. is now resolved   PONV (postoperative nausea and vomiting)             Past Surgical History:  Procedure Laterality Date   BARIATRIC SURGERY       EXTRACORPOREAL SHOCK WAVE LITHOTRIPSY Left 05/16/2020    Procedure: EXTRACORPOREAL SHOCK WAVE LITHOTRIPSY (ESWL);  Surgeon: Remi Haggard, MD;  Location: [facility];  Service: Urology;  Laterality: Left;   KIDNEY SURGERY Right 2008    percutaneous nephrolithotomies   LAPAROSCOPIC GASTRIC SLEEVE RESECTION N/A 03/25/2020    Procedure: LAPAROSCOPIC GASTRIC SLEEVE RESECTION WITH HIATAL HERNIA REPAIR;  Surgeon: Johnathan Hausen, MD;  Location: [facility];  Service: General;  Laterality: N/A;   TONSILLECTOMY  as a child   UPPER GI ENDOSCOPY N/A 03/25/2020    Procedure: UPPER GI ENDOSCOPY;  Surgeon: Johnathan Hausen, MD;  Location: [facility];  Service: General;  Laterality: N/A;      Social History [place]  reports that she has never smoked. She has never used smokeless tobacco. She reports current alcohol use of about 2.0 standard drinks of alcohol per week. She reports that she does not use drugs.   family history includes Breast cancer in her paternal grandmother; Diabetes in her father and mother; Diabetes type I in her father; Esophageal cancer in her father; Heart disease in her father and mother.        Allergies  Allergen Reactions   Morphine And Related Nausea Only      Prefers not to take due to  nausea   Ciprofloxacin Itching and Rash   Sulfamethoxazole Itching and Rash          PHYSICAL EXAMINATION: Vital signs: BP 112/72   Pulse 76   Ht '5\' 8"'$  (1.727 m)   Wt 183 lb (83 kg)   LMP 06/08/2010   BMI 27.83 kg/m   Constitutional: generally well-appearing, no acute distress Psychiatric: alert and oriented x3, cooperative Eyes: extraocular movements intact, anicteric, conjunctiva pink Mouth: oral pharynx moist, no lesions Neck: supple no lymphadenopathy Cardiovascular: heart regular rate and rhythm, no murmur Lungs: clear to auscultation bilaterally Abdomen: soft, nontender, nondistended, no obvious ascites, no peritoneal signs, normal bowel sounds, no organomegaly Rectal: Deferred until colonoscopy Extremities: no clubbing, cyanosis, or lower extremity edema bilaterally Skin: no lesions on visible extremities Neuro: No focal deficits.  Cranial is intact   ASSESSMENT:   1.  Recurrent chest pain as described.  Probably not acid reflux based on history, though she has reflux.  Could be biliary colic, though no stones on previous ultrasound.  Consider esophageal spasm.  Consider multiple non-GI etiologies.  Though the description of chest pain is somewhat atypical for cardiac disease, she should have a formal cardiac evaluation. 2.  Colon cancer screening.  Average risk 3.  Status post sleeve gastrectomy with hiatal hernia repair October 2021 4.  Family history of esophageal cancer in her father   PLAN:   1.  Continue omeprazole 40 mg twice daily (for now). 2.  Reflux precautions 3.  Schedule abdominal ultrasound to rule out gallstones 4.  Refer to cardiology for evaluation of chest pain. 5.  After cleared from cardiology, schedule upper endoscopy to evaluate chronic GERD (rule out Barrett's) and chest pain.The nature of the procedure, as well as the risks, benefits, and alternatives were carefully and thoroughly reviewed with the patient. Ample time for discussion and  questions allowed. The patient understood, was satisfied, and agreed to proceed.  6.  Schedule colonoscopy for colon cancer screening. The nature of the procedure, as well as the risks, benefits, and alternatives were carefully and thoroughly reviewed with the patient. Ample time for discussion and questions allowed. The patient understood, was satisfied, and agreed to proceed.  Can perform the procedures concurrently. 7.  Additional recommendations after the above   The patient did undergo cardiology evaluation which was negative.  Ultrasound was also negative.  After endoscopic procedures

## 2022-03-25 NOTE — Patient Instructions (Addendum)
RECOMMENDATIONS: - Repeat colonoscopy in 10 years for screening purposes. - Patient has a contact number available for emergencies. The signs and symptoms of potential delayed complications were discussed with the patient. Return to normal activities tomorrow. Written discharge instructions were provided to the patient. - Resume previous diet. - Continue present medications. - Decrease omeprazole to 40 mg once daily. I would recommend staying on this dose to avoid recurrent reflux symptoms. If symptoms recur on once daily dose, return to twice daily dosage. - Return to the care of your primary provider. GI follow-up as needed  YOU HAD AN ENDOSCOPIC PROCEDURE TODAY AT [facility]:   Refer to the procedure report that was given to you for any specific questions about what was found during the examination.  If the procedure report does not answer your questions, please call your gastroenterologist to clarify.  If you requested that your care partner not be given the details of your procedure findings, then the procedure report has been included in a sealed envelope for you to review at your convenience later.  YOU SHOULD EXPECT: Some feelings of bloating in the abdomen. Passage of more gas than usual.  Walking can help get rid of the air that was put into your GI tract during the procedure and reduce the bloating. If you had a lower endoscopy (such as a colonoscopy or flexible sigmoidoscopy) you may notice spotting of blood in your stool or on the toilet paper. If you underwent a bowel prep for your procedure, you may not have a normal bowel movement for a few days.  Please Note:  You might notice some irritation and congestion in your nose or some drainage.  This is from the oxygen used during your procedure.  There is no need for concern and it should clear up in a day or so.  SYMPTOMS TO REPORT IMMEDIATELY:  Following lower endoscopy (colonoscopy or flexible  sigmoidoscopy):  Excessive amounts of blood in the stool  Significant tenderness or worsening of abdominal pains  Swelling of the abdomen that is new, acute  Fever of 100F or higher  Following upper endoscopy (EGD)  Vomiting of blood or coffee ground material  New chest pain or pain under the shoulder blades  Painful or persistently difficult swallowing  New shortness of breath  Fever of 100F or higher  Black, tarry-looking stools  For urgent or emergent issues, a gastroenterologist can be reached at any hour by calling [phone]. Do not use MyChart messaging for urgent concerns.    DIET:  We do recommend a small meal at first, but then you may proceed to your regular diet.  Drink plenty of fluids but you should avoid alcoholic beverages for 24 hours.  MEDICATIONS: Continue present medications. Decrease Omeprazole to 40 mg by mouth once daily. Dr. Henrene Pastor recommends staying on this dose to avoid recurrent reflux symptoms. If symptoms recur on the once daily dose, return to the twice daily dosage.  Please see handouts given to you by your recovery nurse.  Thank you for allowing [place] to provide for your healthcare needs today.  ACTIVITY:  You should plan to take it easy for the rest of today and you should NOT DRIVE or use heavy machinery until tomorrow (because of the sedation medicines used during the test).    FOLLOW UP: Our staff will call the number listed on your records the next business day following your procedure.  We will call around 7:15- 8:00 am to check on  you and address any questions or concerns that you may have regarding the information given to you following your procedure. If we do not reach you, we will leave a message.     If any biopsies were taken you will be contacted by phone or by letter within the next 1-3 weeks.  Please call us at [phone] if you have not heard about the biopsies in 3 weeks.    SIGNATURES/CONFIDENTIALITY: You and/or your care  partner have signed paperwork which will be entered into your electronic medical record.  These signatures attest to the fact that that the information above on your After Visit Summary has been reviewed and is understood.  Full responsibility of the confidentiality of this discharge information lies with you and/or your care-partner.

## 2022-03-25 NOTE — Op Note (Signed)
[facility] Patient Name: Jill Cochran Procedure Date: 03/25/2022 3:18 PM MRN: [id number] Endoscopist: Docia Chuck. Henrene Pastor , MD Age: 51 Referring MD:  Date of Birth: Apr 01, 1971 Gender: Female Account #: [id number] Procedure:                Colonoscopy Indications:              Screening for colorectal malignant neoplasm Medicines:                Monitored Anesthesia Care Procedure:                Pre-Anesthesia Assessment:                           - Prior to the procedure, a History and Physical                            was performed, and patient medications and                            allergies were reviewed. The patient's tolerance of                            previous anesthesia was also reviewed. The risks                            and benefits of the procedure and the sedation                            options and risks were discussed with the patient.                            All questions were answered, and informed consent                            was obtained. Prior Anticoagulants: The patient has                            taken no previous anticoagulant or antiplatelet                            agents. ASA Grade Assessment: II - A patient with                            mild systemic disease. After reviewing the risks                            and benefits, the patient was deemed in                            satisfactory condition to undergo the procedure.                           After obtaining informed consent, the colonoscope  was passed under direct vision. Throughout the                            procedure, the patient's blood pressure, pulse, and                            oxygen saturations were monitored continuously. The                            Olympus CF-HQ190L [id number]) Colonoscope was                            introduced through the anus and advanced to the the                            cecum, identified by  appendiceal orifice and                            ileocecal valve. The ileocecal valve, appendiceal                            orifice, and rectum were photographed. The quality                            of the bowel preparation was excellent. The                            colonoscopy was performed without difficulty. The                            patient tolerated the procedure well. The bowel                            preparation used was SUPREP via split dose                            instruction. Scope In: 3:25:14 PM Scope Out: 3:39:17 PM Scope Withdrawal Time: 0 hours 9 minutes 24 seconds  Total Procedure Duration: 0 hours 14 minutes 3 seconds  Findings:                 Multiple small-mouthed diverticula were found in                            the left colon and right colon.                           Internal hemorrhoids were found during                            retroflexion. The hemorrhoids were small.                           The exam was otherwise without abnormality on  direct and retroflexion views. Complications:            No immediate complications. Estimated blood loss:                            None. Estimated Blood Loss:     Estimated blood loss: none. Impression:               - Diverticulosis in the left colon and in the right                            colon.                           - Internal hemorrhoids.                           - The examination was otherwise normal on direct                            and retroflexion views.                           - No specimens collected. Recommendation:           - Repeat colonoscopy in 10 years for screening                            purposes.                           - Patient has a contact number available for                            emergencies. The signs and symptoms of potential                            delayed complications were discussed with the                             patient. Return to normal activities tomorrow.                            Written discharge instructions were provided to the                            patient.                           - Resume previous diet.                           - Continue present medications. Docia Chuck. Henrene Pastor, MD 03/25/2022 3:44:30 PM This report has been signed electronically.

## 2022-03-25 NOTE — Progress Notes (Signed)
Report to PACU, RN, vss, BBS= Clear.  

## 2022-03-26 ENCOUNTER — Telehealth: Payer: Self-pay

## 2022-03-26 NOTE — Telephone Encounter (Signed)
  Follow up Call-     03/25/2022    2:37 PM  Call back number  Post procedure Call Back phone  # [phone]  Permission to leave phone message Yes    Follow up call made.  NALM

## 2022-03-27 ENCOUNTER — Other Ambulatory Visit (HOSPITAL_COMMUNITY): Payer: Self-pay

## 2022-03-27 NOTE — Telephone Encounter (Signed)
We would not be involved with reimbursement. At time of starting PA we were instructed not to complete PA for any "Preps." This would likely be a clinical note that would have to be constructed by the provider for the patient to submit to her insurance.

## 2022-05-24 ENCOUNTER — Ambulatory Visit
Admission: EM | Admit: 2022-05-24 | Discharge: 2022-05-24 | Disposition: A | Discharge status: Home / Self Care | Payer: 59 | Attending: Family Medicine | Admitting: Family Medicine

## 2022-05-24 DIAGNOSIS — Z1152 Encounter for screening for COVID-19: Secondary | ICD-10-CM | POA: Diagnosis not present

## 2022-05-24 DIAGNOSIS — R509 Fever, unspecified: Secondary | ICD-10-CM | POA: Insufficient documentation

## 2022-05-24 DIAGNOSIS — J101 Influenza due to other identified influenza virus with other respiratory manifestations: Secondary | ICD-10-CM | POA: Insufficient documentation

## 2022-05-24 LAB — RESP PANEL BY RT-PCR (FLU A&B, COVID) ARPGX2
Influenza A by PCR: NEGATIVE
Influenza B by PCR: POSITIVE — AB
SARS Coronavirus 2 by RT PCR: NEGATIVE

## 2022-05-24 MED ORDER — PROMETHAZINE-DM 6.25-15 MG/5ML PO SYRP: 5.0000 mL | ORAL_SOLUTION | Freq: Four times a day (QID) | ORAL | 0 refills | Status: DC | PRN

## 2022-05-24 MED ORDER — OSELTAMIVIR PHOSPHATE 75 MG PO CAPS: 75.0000 mg | ORAL_CAPSULE | Freq: Two times a day (BID) | ORAL | 0 refills | Status: DC

## 2022-05-24 NOTE — ED Provider Notes (Signed)
[facility]    CSN: [id number] Arrival date & time: 05/24/22  1247      History   Chief Complaint Chief Complaint  Patient presents with   Fever   Generalized Body Aches    HPI Jill Cochran is a 51 y.o. female.   HPI   Jill Cochran presents for fever body aches, headache, congestion, chills, scratchy throat that started 1 to 2 days ago.  She has been exposed to influenza be by her granddaughters.  She took TheraFlu and Tylenol prior to arrival.       Past Medical History:  Diagnosis Date   Chronic renal failure 2006 or 2008   Secondary to Right  Kidney  renal calculus , right kidney functiopns at 23 percent per pt   Complication of anesthesia    likes scopolamine patch   Depression    GERD (gastroesophageal reflux disease)    History of kidney stones    History of sleep apnea    never used cpap due to weight loss after surgery   Kidney stones    Peripheral vascular disease ([place])    leaky vein in Rt leg. 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   Varicose veins of lower extremities with other complications 46/56/8127    Past Surgical History:  Procedure Laterality Date   BARIATRIC SURGERY     EXTRACORPOREAL SHOCK WAVE LITHOTRIPSY Left 05/16/2020   Procedure: EXTRACORPOREAL SHOCK WAVE LITHOTRIPSY (ESWL);  Surgeon: Remi Haggard, MD;  Location: [facility];  Service: Urology;  Laterality: Left;   KIDNEY SURGERY Right 2008   percutaneous nephrolithotomies   LAPAROSCOPIC GASTRIC SLEEVE RESECTION N/A 03/25/2020   Procedure: LAPAROSCOPIC GASTRIC SLEEVE RESECTION WITH HIATAL HERNIA REPAIR;  Surgeon: Johnathan Hausen, MD;  Location: [facility];  Service: General;  Laterality: N/A;   TONSILLECTOMY     as a child   UPPER GI ENDOSCOPY N/A 03/25/2020   Procedure: UPPER GI  ENDOSCOPY;  Surgeon: Johnathan Hausen, MD;  Location: [facility];  Service: General;  Laterality: N/A;    OB History   No obstetric history on file.      Home Medications    Prior to Admission medications   Medication Sig Start Date End Date Taking? Authorizing Provider  ALPRAZolam Duanne Moron) 0.25 MG tablet Take 0.25 mg by mouth daily as needed for anxiety.  02/09/20  Yes [provider]  Cholecalciferol (VITAMIN D) 50 MCG (2000 UT) CAPS Take 2,000 Units by mouth daily.   Yes [provider]  indapamide (LOZOL) 2.5 MG tablet Take 2.5 mg by mouth daily.   Yes [provider]  omeprazole (PRILOSEC) 40 MG capsule Take 1 capsule (40 mg total) by mouth daily. 03/25/22  Yes Irene Shipper, MD  oseltamivir (TAMIFLU) 75 MG capsule Take 1 capsule (75 mg total) by mouth every 12 (twelve) hours. 05/24/22  Yes Timmie Calix, DO  promethazine-dextromethorphan (PROMETHAZINE-DM) 6.25-15 MG/5ML syrup Take 5 mLs by mouth 4 (four) times daily as needed. 05/24/22  Yes Fitz Matsuo, DO  FLUoxetine (PROZAC) 20 MG capsule TAKE 1 CAPSULE BY MOUTH ONCE DAILY **PATIENT PREFERS CAPSULES FOR COST-SAVINGS**    [provider]  metoprolol tartrate (LOPRESSOR) 100 MG tablet Take 1 tablet (100 mg total) by mouth once for 1 dose. PLEASE TAKE METOPROLOL 2  HOURS PRIOR TO CTA  SCAN. 02/12/22 02/12/22  Elouise Munroe, MD  Multiple Vitamin (MULTIVITAMIN WITH MINERALS) TABS tablet Take 1 tablet by mouth daily.    [provider]    Family History Family History  Problem Relation Age of Onset   Diabetes Mother    Heart disease Mother    Diabetes Father    Heart disease Father    Esophageal cancer Father    Diabetes type I Father    Breast cancer Paternal Grandmother    Stomach cancer Neg Hx    Colon cancer Neg Hx     Social History Social History   Tobacco Use   Smoking status: Never   Smokeless tobacco: Never  Vaping Use   Vaping Use: Never used  Substance Use Topics    Alcohol use: Yes    Alcohol/week: 2.0 standard drinks of alcohol    Types: 2 Standard drinks or equivalent per week    Comment: ocassionally   Drug use: No     Allergies   Morphine and related, Ciprofloxacin, and Sulfamethoxazole   Review of Systems Review of Systems: negative unless otherwise stated in HPI.      Physical Exam Triage Vital Signs ED Triage Vitals  Enc Vitals Group     BP 05/24/22 1407 102/71     Pulse Rate 05/24/22 1407 89     Resp --      Temp 05/24/22 1407 98.6 F (37 C)     Temp Source 05/24/22 1407 Oral     SpO2 05/24/22 1407 96 %     Weight 05/24/22 1405 184 lb (83.5 kg)     Height 05/24/22 1405 '5\' 8"'$  (0.350 m)     Head Circumference --      Peak Flow --      Pain Score 05/24/22 1405 3     Pain Loc --      Pain Edu? --      Excl. in [place]? --    No data found.  Updated Vital Signs BP 102/71 (BP Location: Right Arm)   Pulse 89   Temp 98.6 F (37 C) (Oral)   Ht '5\' 8"'$  (1.727 m)   Wt 83.5 kg   LMP 06/08/2010   SpO2 96%   BMI 27.98 kg/m   Visual Acuity Right Eye Distance:   Left Eye Distance:   Bilateral Distance:    Right Eye Near:   Left Eye Near:    Bilateral Near:     Physical Exam GEN:     alert, ill but non-toxic appearing female in no distress    HENT:  mucus membranes moist, oropharyngeal without lesions or exudate, no tonsillar hypertrophy, mild oropharyngeal erythema, clear nasal discharge EYES:   pupils equal and reactive, no scleral injection or discharge NECK:  normal ROM, anterior lymphadenopathy, no meningismus   RESP:  no increased work of breathing, clear to auscultation bilaterally CVS:   regular rate and rhythm Skin:   warm and dry    UC Treatments / Results  Labs (all labs ordered are listed, but only abnormal results are displayed) Labs Reviewed  RESP PANEL BY RT-PCR (FLU A&B, COVID) ARPGX2 - Abnormal; Notable for the following components:      Result Value   Influenza B by PCR POSITIVE (*)    All other  components within normal limits    EKG   Radiology No results found.  Procedures Procedures (including critical care time)  Medications Ordered in UC Medications - No data to display  Initial Impression / Assessment and Plan / UC Course  I have reviewed the triage vital signs and the nursing notes.  Pertinent labs & imaging results that were available during my care of the patient were reviewed by me and considered in my medical decision making (see chart for details).       Pt is a 51 y.o. female who presents for 1-2  days of respiratory symptoms with known exposure to the flu. Jill Cochran is afebrile here but has used recent antipyretics. Satting well on room air. Overall pt is ill but non-toxic appearing, well hydrated, without respiratory distress. Pulmonary exam is unremarkable.  COVID and influenza testing obtained and was positive for influenza B. Discussed symptomatic treatment.  Treat with Tamiflu and promethazine for cough typical duration of symptoms discussed.   Return and ED precautions given and voiced understanding. Discussed MDM, treatment plan and plan for follow-up with patient and husband who agree with plan.     Final Clinical Impressions(s) / UC Diagnoses   Final diagnoses:  Influenza B     Discharge Instructions      Your influenza test was positive, as expected.  Stop at the pharmacy to pick up your Tamiflu.   You can take Tylenol and/or Ibuprofen as needed for fever reduction and pain relief.    For cough: honey 1/2 to 1 teaspoon (you can dilute the honey in water or another fluid).  You can also use guaifenesin and dextromethorphan for cough. You can use a humidifier for chest congestion and cough.  If you don't have a humidifier, you can sit in the bathroom with the hot shower running.      For sore throat: try warm salt water gargles, Mucinex sore throat cough drops or cepacol lozenges, throat spray, warm tea or water with lemon/honey, popsicles or  ice, or OTC cold relief medicine for throat discomfort. You can also purchase chloraseptic spray at the pharmacy or dollar store.   For congestion: take a daily anti-histamine like Zyrtec, Claritin, and a oral decongestant, such as pseudoephedrine.  You can also use Flonase 1-2 sprays in each nostril daily. Afrin is also a good option, if you do not have high blood pressure.    It is important to stay hydrated: drink plenty of fluids (water, gatorade/powerade/pedialyte, juices, or teas) to keep your throat moisturized and help further relieve irritation/discomfort.    Return or go to the Emergency Department if symptoms worsen or do not improve in the next few days      ED Prescriptions     Medication Sig Dispense Auth. Provider   oseltamivir (TAMIFLU) 75 MG capsule Take 1 capsule (75 mg total) by mouth every 12 (twelve) hours. 10 capsule Olly Shiner, DO   promethazine-dextromethorphan (PROMETHAZINE-DM) 6.25-15 MG/5ML syrup Take 5 mLs by mouth 4 (four) times daily as needed. 118 mL Lyndee Hensen, DO      PDMP not reviewed this encounter.   Lyndee Hensen, DO 05/24/22 1528

## 2022-05-24 NOTE — Discharge Instructions (Addendum)
Your influenza test was positive, as expected.  Stop at the pharmacy to pick up your Tamiflu.   You can take Tylenol and/or Ibuprofen as needed for fever reduction and pain relief.    For cough: honey 1/2 to 1 teaspoon (you can dilute the honey in water or another fluid).  You can also use guaifenesin and dextromethorphan for cough. You can use a humidifier for chest congestion and cough.  If you don't have a humidifier, you can sit in the bathroom with the hot shower running.      For sore throat: try warm salt water gargles, Mucinex sore throat cough drops or cepacol lozenges, throat spray, warm tea or water with lemon/honey, popsicles or ice, or OTC cold relief medicine for throat discomfort. You can also purchase chloraseptic spray at the pharmacy or dollar store.   For congestion: take a daily anti-histamine like Zyrtec, Claritin, and a oral decongestant, such as pseudoephedrine.  You can also use Flonase 1-2 sprays in each nostril daily. Afrin is also a good option, if you do not have high blood pressure.    It is important to stay hydrated: drink plenty of fluids (water, gatorade/powerade/pedialyte, juices, or teas) to keep your throat moisturized and help further relieve irritation/discomfort.    Return or go to the Emergency Department if symptoms worsen or do not improve in the next few days

## 2022-05-24 NOTE — ED Triage Notes (Addendum)
Pt c/o fever, body aches onset yesterday. Patient does report positive exposure to the flu

## 2022-10-29 ENCOUNTER — Encounter (HOSPITAL_COMMUNITY): Payer: Self-pay | Admitting: *Deleted

## 2023-10-09 ENCOUNTER — Ambulatory Visit
Admission: EM | Admit: 2023-10-09 | Discharge: 2023-10-09 | Disposition: A | Discharge status: Home / Self Care | Attending: Physician Assistant | Admitting: Physician Assistant

## 2023-10-09 DIAGNOSIS — R220 Localized swelling, mass and lump, head: Secondary | ICD-10-CM | POA: Diagnosis not present

## 2023-10-09 DIAGNOSIS — T7840XA Allergy, unspecified, initial encounter: Secondary | ICD-10-CM

## 2023-10-09 MED ORDER — FAMOTIDINE 40 MG PO TABS
40.0000 mg | ORAL_TABLET | ORAL | Status: DC | PRN
  Administered 2023-10-09: 40 mg via ORAL

## 2023-10-09 MED ORDER — PREDNISONE 10 MG (21) PO TBPK: ORAL_TABLET | ORAL | 0 refills | Status: DC

## 2023-10-09 MED ORDER — EPINEPHRINE 0.3 MG/0.3ML IJ SOAJ: 0.3000 mg | INTRAMUSCULAR | 0 refills | Status: AC | PRN

## 2023-10-09 MED ORDER — DEXAMETHASONE SODIUM PHOSPHATE 10 MG/ML IJ SOLN
10.0000 mg | Freq: Once | INTRAMUSCULAR | Status: AC
  Administered 2023-10-09: 10 mg via INTRAMUSCULAR

## 2023-10-09 MED ORDER — DEXAMETHASONE SODIUM PHOSPHATE 10 MG/ML IJ SOLN: 10.0000 mg | Freq: Once | INTRAMUSCULAR | Status: DC

## 2023-10-09 NOTE — ED Provider Notes (Addendum)
 [facility]    CSN: [id number] Arrival date & time: 10/09/23  1543      History   Chief Complaint Chief Complaint  Patient presents with   Allergic Reaction    HPI NAYELLI RINDELS is a 53 y.o. female.   Patient presents today with a 20-minute history of swelling involving her right lower lip extending into her jaw.  She reports that she was out with her husband and was eating pizza and drinking a soda when her symptoms began.  She denies any new exposures and has had both of these things in the past.  Denies any recent medication changes.  She does not take ACE or ARB.  She does have a history of swelling associated with bee stings but does not believe that she was bitten or stung by an insect.  She denies history of anaphylaxis and has never needed an EpiPen.  Immediately after her symptoms began her husband brought her to our clinic though he did have Benadryl  in the car and so she took 50 mg of this.  She also had 20 mg from a previous prescription of prednisone that she took and feels that this might have improved her symptoms.  She denies any swelling involving her throat, dysphagia, muffled voice, hives/rash, nausea/vomiting/diarrhea, shortness of breath, chest pain.    Past Medical History:  Diagnosis Date   Chronic renal failure 2006 or 2008   Secondary to Right  Kidney  renal calculus , right kidney functiopns at 23 percent per pt   Complication of anesthesia    likes scopolamine  patch   Depression    GERD (gastroesophageal reflux disease)    History of kidney stones    History of sleep apnea    never used cpap due to weight loss after surgery   Kidney stones    Peripheral vascular disease ([facility])    leaky vein in Rt leg. 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   Varicose veins of bilateral  lower extremities with other complications 02/02/2013    Past Surgical History:  Procedure Laterality Date   BARIATRIC SURGERY     EXTRACORPOREAL SHOCK WAVE LITHOTRIPSY Left 05/16/2020   Procedure: EXTRACORPOREAL SHOCK WAVE LITHOTRIPSY (ESWL);  Surgeon: Sherlyn Ditto, MD;  Location: [facility];  Service: Urology;  Laterality: Left;   KIDNEY SURGERY Right 2008   percutaneous nephrolithotomies   LAPAROSCOPIC GASTRIC SLEEVE RESECTION N/A 03/25/2020   Procedure: LAPAROSCOPIC GASTRIC SLEEVE RESECTION WITH HIATAL HERNIA REPAIR;  Surgeon: Jacolyn Matar, MD;  Location: [facility];  Service: General;  Laterality: N/A;   TONSILLECTOMY     as a child   UPPER GI ENDOSCOPY N/A 03/25/2020   Procedure: UPPER GI ENDOSCOPY;  Surgeon: Jacolyn Matar, MD;  Location: [facility];  Service: General;  Laterality: N/A;    OB History   No obstetric history on file.      Home Medications    Prior to Admission medications   Medication Sig Start Date End Date Taking? Authorizing Provider  EPINEPHrine 0.3 mg/0.3 mL IJ SOAJ injection Inject 0.3 mg into the muscle as needed for anaphylaxis. 10/09/23  Yes Trevia Nop K, PA-C  predniSONE (STERAPRED UNI-PAK 21 TAB) 10 MG (21) TBPK tablet As directed 10/09/23  Yes Blanche Gallien K, PA-C  ALPRAZolam (XANAX) 0.25 MG tablet Take  0.25 mg by mouth daily as needed for anxiety.  02/09/20   [provider]  Cholecalciferol (VITAMIN D) 50 MCG (2000 UT) CAPS Take 2,000 Units by mouth daily.    [provider]  FLUoxetine (PROZAC) 20 MG capsule TAKE 1 CAPSULE BY MOUTH ONCE DAILY **PATIENT PREFERS CAPSULES FOR COST-SAVINGS** Patient not taking: Reported on 10/09/2023    [provider]  indapamide (LOZOL) 2.5 MG tablet Take 2.5 mg by mouth daily.    [provider]  metoprolol  tartrate (LOPRESSOR ) 100 MG tablet Take 1 tablet (100 mg total) by mouth once for 1 dose. PLEASE TAKE METOPROLOL  2  HOURS PRIOR TO CTA SCAN. 02/12/22 02/12/22   Acharya, Gayatri A, MD  Multiple Vitamin (MULTIVITAMIN WITH MINERALS) TABS tablet Take 1 tablet by mouth daily.    [provider]  omeprazole  (PRILOSEC) 40 MG capsule Take 1 capsule (40 mg total) by mouth daily. 03/25/22   Tobin Forts, MD    Family History Family History  Problem Relation Age of Onset   Diabetes Mother    Heart disease Mother    Diabetes Father    Heart disease Father    Esophageal cancer Father    Diabetes type I Father    Breast cancer Paternal Grandmother    Stomach cancer Neg Hx    Colon cancer Neg Hx     Social History Social History   Tobacco Use   Smoking status: Never   Smokeless tobacco: Never  Vaping Use   Vaping status: Never Used  Substance Use Topics   Alcohol use: Yes    Alcohol/week: 2.0 standard drinks of alcohol    Types: 2 Standard drinks or equivalent per week    Comment: ocassionally   Drug use: No     Allergies   Morphine  and codeine, Ciprofloxacin, and Sulfamethoxazole   Review of Systems Review of Systems  Constitutional:  Negative for activity change, appetite change, fatigue and fever.  HENT:  Positive for facial swelling. Negative for congestion, ear discharge, sore throat, trouble swallowing and voice change.   Respiratory:  Negative for cough, chest tightness, shortness of breath and wheezing.   Cardiovascular:  Negative for chest pain.  Gastrointestinal:  Negative for abdominal pain, diarrhea, nausea and vomiting.  Skin:  Negative for rash.  Neurological:  Negative for dizziness and light-headedness.     Physical Exam Triage Vital Signs ED Triage Vitals  Encounter Vitals Group     BP 10/09/23 1544 (!) 157/97     Systolic BP Percentile --      Diastolic BP Percentile --      Pulse Rate 10/09/23 1544 78     Resp 10/09/23 1544 18     Temp 10/09/23 1544 97.8 F (36.6 C)     Temp src --      SpO2 10/09/23 1544 96 %     Weight --      Height --      Head Circumference --      Peak Flow --       Pain Score 10/09/23 1552 0     Pain Loc --      Pain Education --      Exclude from Growth Chart --    No data found.  Updated Vital Signs BP 125/78   Pulse 78   Temp 97.8 F (36.6 C)   Resp 19   LMP 06/08/2010   SpO2 96%   Visual Acuity Right Eye Distance:   Left Eye  Distance:   Bilateral Distance:    Right Eye Near:   Left Eye Near:    Bilateral Near:     Physical Exam Vitals reviewed.  Constitutional:      General: She is awake. She is not in acute distress.    Appearance: Normal appearance. She is well-developed. She is not ill-appearing.     Comments: Very pleasant female appears stated age in no acute distress sitting comfortably in exam room  HENT:     Head: Normocephalic and atraumatic.      Mouth/Throat:     Pharynx: Uvula midline. No oropharyngeal exudate or posterior oropharyngeal erythema.     Comments: Normal-appearing posterior oropharynx.  No tongue swelling.  No evidence of Ludwig angina. Cardiovascular:     Rate and Rhythm: Normal rate and regular rhythm.     Heart sounds: Normal heart sounds, S1 normal and S2 normal. No murmur heard. Pulmonary:     Effort: Pulmonary effort is normal.     Breath sounds: Normal breath sounds. No wheezing, rhonchi or rales.     Comments: Clear to auscultation bilaterally Abdominal:     General: Bowel sounds are normal.     Palpations: Abdomen is soft.     Tenderness: There is no abdominal tenderness. There is no right CVA tenderness, left CVA tenderness, guarding or rebound.  Skin:    Findings: No rash.  Psychiatric:        Behavior: Behavior is cooperative.      UC Treatments / Results  Labs (all labs ordered are listed, but only abnormal results are displayed) Labs Reviewed - No data to display  EKG   Radiology No results found.  Procedures Procedures (including critical care time)  Medications Ordered in UC Medications  famotidine (PEPCID) tablet 40 mg (40 mg Oral Given 10/09/23 1549)   dexamethasone  (DECADRON ) injection 10 mg (10 mg Intramuscular Given 10/09/23 1549)    Initial Impression / Assessment and Plan / UC Course  I have reviewed the triage vital signs and the nursing notes.  Pertinent labs & imaging results that were available during my care of the patient were reviewed by me and considered in my medical decision making (see chart for details).     Patient was initially mildly hypertensive but was otherwise well-appearing, nontoxic, nontachycardic.  She had no rash, GI symptoms, shortness of breath, swelling of her throat concerning for acute anaphylaxis but did have facial swelling.   She was given 10 mg of Decadron  IM and 40 mg of Pepcid.  No additional H1 blockade was given as she had already had 50 mg of Benadryl  prior to arrival.  She was monitored for 30 minutes and reported improvement of his symptoms with no additional swelling or signs of anaphylaxis.  Repeat vitals showed normalization of her blood pressure with appropriate heart rate and oxygen saturation.  Given her improvement we will start prednisone taper tomorrow (10/10/2023) and she was instructed to take NSAIDs with this medication due to risk of GI bleeding.  Will continue H1 and H2 blockade starting tomorrow given she has already had these medications today.  A prescription for an EpiPen was provided and we discussed the importance of picking this up immediately and keeping this with her at all times.  If she has any additional swelling or other symptoms such as rash, swelling of her throat, shortness of breath, muffled voice, nausea, vomiting, wheezing she would need to administer the EpiPen and call 911 to be seen in the emergency  room.  She was accompanied by her husband who expressed understanding and agreement and will monitor his wife for any worsening symptoms and seek immediate medical attention with any concerns.  Given it is unclear what triggered her symptoms I did recommend she follow-up with her  primary care on Monday for further evaluation and management.  Discussed that she should have a low threshold for going to the ER with anything that worsens.  Strict return precautions were given.  All questions were answered to patient and husband satisfaction.  Final Clinical Impressions(s) / UC Diagnoses   Final diagnoses:  Right facial swelling  Allergic reaction, initial encounter     Discharge Instructions      I am glad that you are feeling better and that the swelling has gone down with the medication.  Please start prednisone tomorrow (10/10/2023).  Do not take NSAIDs with this medication including aspirin, ibuprofen/Advil, naproxen/Aleve.  Take Zyrtec during the day starting tomorrow and Pepcid at night (famotidine).  They should also be started tomorrow since you already had Benadryl  and Pepcid today.  I have called in an EpiPen.  If you have any additional swelling, unusual sensation in your throat, trouble swallowing, shortness of breath, wheezing, nausea, vomiting, hives please administer the EpiPen and go immediately to the emergency room.  If anything changes at all go to the ER.  Follow-up with your primary care first thing next week.     ED Prescriptions     Medication Sig Dispense Auth. Provider   predniSONE (STERAPRED UNI-PAK 21 TAB) 10 MG (21) TBPK tablet As directed 21 tablet Fallon Howerter K, PA-C   EPINEPHrine 0.3 mg/0.3 mL IJ SOAJ injection Inject 0.3 mg into the muscle as needed for anaphylaxis. 1 each Veralyn Lopp, Betsey Brow, PA-C      PDMP not reviewed this encounter.   Budd Cargo, PA-C 10/09/23 1650    RaspetBetsey Brow, PA-C 10/09/23 1651

## 2023-10-09 NOTE — ED Triage Notes (Addendum)
 Patient to Urgent Care with complaints of sudden onset mouth/ lip swelling that started approx 25- 30 minutes ago. Also reports tingling in bilateral hands and arms. No SHOB. Reports some difficulty swallowing d/t lip swelling.  Denies any throat itching   Reports she drank a coffee w/ lavender and honey but is has never had a reaction to either. Denies any known allergen exposures.  50mg  benadryl  PO/ prednisone 20mg  PO PTA.

## 2023-10-09 NOTE — Discharge Instructions (Signed)
 I am glad that you are feeling better and that the swelling has gone down with the medication.  Please start prednisone tomorrow (10/10/2023).  Do not take NSAIDs with this medication including aspirin, ibuprofen/Advil, naproxen/Aleve.  Take Zyrtec during the day starting tomorrow and Pepcid at night (famotidine).  They should also be started tomorrow since you already had Benadryl  and Pepcid today.  I have called in an EpiPen.  If you have any additional swelling, unusual sensation in your throat, trouble swallowing, shortness of breath, wheezing, nausea, vomiting, hives please administer the EpiPen and go immediately to the emergency room.  If anything changes at all go to the ER.  Follow-up with your primary care first thing next week.

## 2023-10-28 ENCOUNTER — Ambulatory Visit: Admitting: Internal Medicine

## 2023-10-28 ENCOUNTER — Encounter: Payer: Self-pay | Admitting: Internal Medicine

## 2023-10-28 VITALS — BP 120/76 | HR 80 | Ht 67.0 in

## 2023-10-28 DIAGNOSIS — K219 Gastro-esophageal reflux disease without esophagitis: Secondary | ICD-10-CM

## 2023-10-28 DIAGNOSIS — Z8719 Personal history of other diseases of the digestive system: Secondary | ICD-10-CM | POA: Diagnosis not present

## 2023-10-28 DIAGNOSIS — R112 Nausea with vomiting, unspecified: Secondary | ICD-10-CM

## 2023-10-28 DIAGNOSIS — R1110 Vomiting, unspecified: Secondary | ICD-10-CM

## 2023-10-28 DIAGNOSIS — K2100 Gastro-esophageal reflux disease with esophagitis, without bleeding: Secondary | ICD-10-CM

## 2023-10-28 MED ORDER — OMEPRAZOLE 40 MG PO CPDR: 40.0000 mg | DELAYED_RELEASE_CAPSULE | Freq: Two times a day (BID) | ORAL | 11 refills | Status: AC

## 2023-10-28 NOTE — Progress Notes (Signed)
 HISTORY OF PRESENT ILLNESS:  Jill Cochran is a 53 y.o. 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  She has a history of morbid obesity and is status post sleeve gastrectomy with hiatal hernia repair by Dr. Jacolyn Matar October 2021.  She also has a history of chronic GERD for which she takes omeprazole  40 mg daily.  I last saw the patient in the office April 05, 2022 regarding recurrent chest pain and colon cancer screening.  See that dictation for details.  She subsequently underwent colonoscopy and upper endoscopy on 10/23/2021.  Colonoscopy revealed diverticulosis and internal hemorrhoids but was otherwise normal.  Follow-up in 10 years recommended.  Upper endoscopy revealed evidence of prior sleeve gastrectomy without other issues.  Her omeprazole  was decreased to 40 mg daily and she was instructed to follow-up as needed.  She initially lost about 80 pounds after her sleeve gastrectomy.  She has put back approximately 20 pounds.  Declined being weighed today.  She has noticed over the past several months that if she overeats or if she eats after 7 PM she may suffer with regurgitation of gastric contents.  This is associated with unpleasant pyrosis.  She does tell me that her meal portions size has increased over time since her surgery.  No other complaints.  REVIEW OF SYSTEMS:  All non-GI ROS negative. Past Medical History:  Diagnosis Date   Chronic renal failure 2006 or 2008   Secondary to Right  Kidney  renal calculus , right kidney functiopns at 23 percent per pt   Complication of anesthesia    likes scopolamine  patch   Depression    GERD (gastroesophageal reflux disease)    History of kidney stones    History of sleep apnea    never used cpap due to weight loss after surgery   Kidney stones    Peripheral vascular disease (HCC)    leaky vein in Rt leg. is now resolved   PONV  (postoperative nausea and vomiting)     Past Surgical History:  Procedure Laterality Date   BARIATRIC SURGERY     EXTRACORPOREAL SHOCK WAVE LITHOTRIPSY Left 05/16/2020   Procedure: EXTRACORPOREAL SHOCK WAVE LITHOTRIPSY (ESWL);  Surgeon: Sherlyn Ditto, MD;  Location: [facility];  Service: Urology;  Laterality: Left;   KIDNEY SURGERY Right 2008   percutaneous nephrolithotomies   LAPAROSCOPIC GASTRIC SLEEVE RESECTION N/A 03/25/2020   Procedure: LAPAROSCOPIC GASTRIC SLEEVE RESECTION WITH HIATAL HERNIA REPAIR;  Surgeon: Jacolyn Matar, MD;  Location: [facility];  Service: General;  Laterality: N/A;   TONSILLECTOMY     as a child   UPPER GI ENDOSCOPY N/A 03/25/2020   Procedure: UPPER GI ENDOSCOPY;  Surgeon: Jacolyn Matar, MD;  Location: [facility];  Service: General;  Laterality: N/A;    Social History Kari Otto  reports that she has never smoked. She has never used smokeless tobacco. She reports current alcohol use of about 2.0 standard drinks of alcohol per week. She reports that she does not use drugs.  family history includes Breast cancer in her paternal grandmother; Diabetes in her father and mother; Diabetes type I in her father; Esophageal cancer in her father; Heart disease in her father and mother.  Allergies  Allergen Reactions   Morphine  And Codeine Nausea Only    Prefers not to take due to nausea   Ciprofloxacin Itching and Rash   Sulfamethoxazole Itching and Rash  PHYSICAL EXAMINATION: Vital signs: BP 120/76   Pulse 80   Ht 5\' 7"  (1.702 m)   LMP 06/08/2010   BMI 28.82 kg/m   Constitutional: generally well-appearing, no acute distress Psychiatric: alert and oriented x3, cooperative Eyes: extraocular movements intact, anicteric, conjunctiva pink Mouth: oral pharynx moist, no lesions Neck: supple no lymphadenopathy Cardiovascular: heart regular rate and rhythm, no murmur Lungs: clear to auscultation bilaterally Abdomen: soft, nontender,  nondistended, no obvious ascites, no peritoneal signs, normal bowel sounds, no organomegaly.  No succussion splash Rectal: Omitted Extremities: no clubbing, cyanosis, or lower extremity edema bilaterally Skin: no lesions on visible extremities Neuro: No focal deficits.  Cranial nerves intact  ASSESSMENT:  1.  Several months of regurgitation gastric contents as described.  I suspect that her problem is a combination of overeating with prior bariatric anatomy as well as underlying GERD.  Other possibilities include postoperative stricturing with outlet obstructive type symptoms. 2.  GERD.  Experiencing breakthrough symptoms currently. 3.  Screening colonoscopy 2023 with diverticulosis and hemorrhoids   PLAN:  1.  Increase omeprazole  to 40 mg p.o. twice daily.  Prescribed. 2.  Strict adherence to reflux precautions with particular attention to meal size and not eating for several hours prior to bedtime.  Elevate head of bed. 3.  Schedule upper endoscopy to evaluate upper GI anatomy and recurrent symptoms as described.The nature of the procedure, as well as the risks, benefits, and alternatives were carefully and thoroughly reviewed with the patient. Ample time for discussion and questions allowed. The patient understood, was satisfied, and agreed to proceed. A total time of 35 minutes was spent preparing to see the patient, obtaining comprehensive interval history, performing medically appropriate physical examination, ordering medication, reviewing lifestyle modification, ordering endoscopic procedure, and documenting clinical information in the health record

## 2023-10-28 NOTE — Patient Instructions (Signed)
 _______________________________________________________  If your blood pressure at your visit was 140/90 or greater, please contact your primary care physician to follow up on this.  _______________________________________________________  If you are age 55 or older, your body mass index should be between 23-30. Your Body mass index is 28.82 kg/m. If this is out of the aforementioned range listed, please consider follow up with your Primary Care Provider.  If you are age 69 or younger, your body mass index should be between 19-25. Your Body mass index is 28.82 kg/m. If this is out of the aformentioned range listed, please consider follow up with your Primary Care Provider.   ________________________________________________________  The [facility] providers would like to encourage you to use MYCHART to communicate with providers for non-urgent requests or questions.  Due to long hold times on the telephone, sending your provider a message by [facility] may be a faster and more efficient way to get a response.  Please allow 48 business hours for a response.  Please remember that this is for non-urgent requests.  _______________________________________________________   Elene Griffes have been scheduled for an endoscopy. Please follow written instructions given to you at your visit today.  If you use inhalers (even only as needed), please bring them with you on the day of your procedure.  If you take any of the following medications, they will need to be adjusted prior to your procedure:   DO NOT TAKE 7 DAYS PRIOR TO TEST- Trulicity (dulaglutide) Ozempic, Wegovy (semaglutide) Mounjaro (tirzepatide) Bydureon Bcise (exanatide extended release)  DO NOT TAKE 1 DAY PRIOR TO YOUR TEST Rybelsus (semaglutide) Adlyxin (lixisenatide) Victoza (liraglutide) Byetta (exanatide) ___________________________________________________________________________    We have sent the following medications to your  pharmacy for you to pick up at your convenience: Omeprazole  40 mg

## 2023-12-16 ENCOUNTER — Encounter: Admitting: Internal Medicine

## 2024-01-24 ENCOUNTER — Other Ambulatory Visit: Payer: Self-pay

## 2024-01-24 ENCOUNTER — Emergency Department

## 2024-01-24 ENCOUNTER — Emergency Department
Admission: EM | Admit: 2024-01-24 | Discharge: 2024-01-24 | Disposition: A | Discharge status: Home / Self Care | Attending: Emergency Medicine | Admitting: Emergency Medicine

## 2024-01-24 ENCOUNTER — Ambulatory Visit
Admission: EM | Admit: 2024-01-24 | Discharge: 2024-01-24 | Disposition: A | Discharge status: Home / Self Care | Attending: Emergency Medicine | Admitting: Emergency Medicine

## 2024-01-24 DIAGNOSIS — R079 Chest pain, unspecified: Secondary | ICD-10-CM | POA: Diagnosis not present

## 2024-01-24 DIAGNOSIS — R002 Palpitations: Secondary | ICD-10-CM | POA: Insufficient documentation

## 2024-01-24 DIAGNOSIS — R0789 Other chest pain: Secondary | ICD-10-CM | POA: Insufficient documentation

## 2024-01-24 DIAGNOSIS — I129 Hypertensive chronic kidney disease with stage 1 through stage 4 chronic kidney disease, or unspecified chronic kidney disease: Secondary | ICD-10-CM | POA: Diagnosis not present

## 2024-01-24 DIAGNOSIS — N189 Chronic kidney disease, unspecified: Secondary | ICD-10-CM | POA: Diagnosis not present

## 2024-01-24 LAB — BASIC METABOLIC PANEL WITH GFR
Anion gap: 12 (ref 5–15)
BUN: 11 mg/dL (ref 6–20)
CO2: 24 mmol/L (ref 22–32)
Calcium: 9.7 mg/dL (ref 8.9–10.3)
Chloride: 104 mmol/L (ref 98–111)
Creatinine, Ser: 0.99 mg/dL (ref 0.44–1.00)
GFR, Estimated: >60 mL/min (ref >60)
Glucose, Bld: 94 mg/dL (ref 70–99)
Potassium: 3.5 mmol/L (ref 3.5–5.1)
Sodium: 140 mmol/L (ref 135–145)

## 2024-01-24 LAB — CBC
HCT: 46.8 % — ABNORMAL HIGH (ref 36.0–46.0)
Hemoglobin: 15.7 g/dL — ABNORMAL HIGH (ref 12.0–15.0)
MCH: 28.6 pg (ref 26.0–34.0)
MCHC: 33.5 g/dL (ref 30.0–36.0)
MCV: 85.4 fL (ref 80.0–100.0)
Platelets: 159 K/uL (ref 150–400)
RBC: 5.48 MIL/uL — ABNORMAL HIGH (ref 3.87–5.11)
RDW: 12.8 % (ref 11.5–15.5)
WBC: 4.9 K/uL (ref 4.0–10.5)
nRBC: 0 % (ref 0.0–0.2)

## 2024-01-24 LAB — TSH: TSH: 1.689 u[IU]/mL (ref 0.350–4.500)

## 2024-01-24 LAB — TROPONIN I (HIGH SENSITIVITY)
Troponin I (High Sensitivity): 4 ng/L (ref <18)
Troponin I (High Sensitivity): 4 ng/L (ref <18)

## 2024-01-24 NOTE — Discharge Instructions (Addendum)
 Your workup was reassuring but you should follow-up with cardiology to discuss further workup such as Holter monitor for your palpitations.  You should return to the ER if you develop worsening chest pain , shortness of breath, leg swelling change in symptoms or any other concerns

## 2024-01-24 NOTE — ED Notes (Signed)
 Patient is being discharged from the Urgent Care and sent to the Emergency Department via POV . Per Burnard Cork NP, patient is in need of higher level of care due to chest pain/ heart palpitations. Patient is aware and verbalizes understanding of plan of care.  Vitals:   01/24/24 1034  BP: 124/84  Pulse: 97  Resp: 20  Temp: 98.1 F (36.7 C)  SpO2: 98%

## 2024-01-24 NOTE — ED Provider Notes (Addendum)
 [facility] Provider Note    Event Date/Time   First MD Initiated Contact with Patient 01/24/24 1146     (approximate)   History   Chest Pain   HPI  Jill Cochran is a 53 y.o. female with history of hypertension, CKD, peripheral vascular disease,'s kidney stones, GERD who comes in with concerns for chest pain.  Patient reportedly had some midsternal chest pain and heart palpitations this morning.  She did report feeling nervous.  This lasted about 30 minutes.  It then went away but when she was driving she had another episode of chest pain.  These resolved after sitting on the side of the road.  She does report having a lot of stress.  She was seen over urgent care where she had no concerns on her EKG but given the chest pain sent to the ER for evaluation.  She reports that she just feels very anxious and stressed out recently.  She reports undergoing a lot of legal issues with her family member being sick.  She denies any chest pain shortness of breath or other concerns at this time.  She states that she feels that the palpitations were just some anxiety.  She denies any history of heart attack previously.     Physical Exam   Triage Vital Signs: ED Triage Vitals  Encounter Vitals Group     BP 01/24/24 1110 (!) 134/94     Girls Systolic BP Percentile --      Girls Diastolic BP Percentile --      Boys Systolic BP Percentile --      Boys Diastolic BP Percentile --      Pulse Rate 01/24/24 1110 67     Resp 01/24/24 1110 16     Temp 01/24/24 1110 97.9 F (36.6 C)     Temp src --      SpO2 01/24/24 1110 99 %     Weight 01/24/24 1114 188 lb (85.3 kg)     Height 01/24/24 1114 5' 8 (1.727 m)     Head Circumference --      Peak Flow --      Pain Score 01/24/24 1114 0     Pain Loc --      Pain Education --      Exclude from Growth Chart --     Most recent vital signs: Vitals:   01/24/24 1110  BP: (!) 134/94  Pulse: 67  Resp: 16  Temp: 97.9 F  (36.6 C)  SpO2: 99%     General: Awake, no distress.  CV:  Good peripheral perfusion.  Resp:  Normal effort.  Clear lung Abd:  No distention.  Soft nontender Other:  No swelling in legs.  No calf tenderness Sensation intact throughout.  Equal pulses throughout  ED Results / Procedures / Treatments   Labs (all labs ordered are listed, but only abnormal results are displayed) Labs Reviewed  BASIC METABOLIC PANEL WITH GFR  CBC  TROPONIN I (HIGH SENSITIVITY)     EKG  My interpretation of EKG:  Normal sinus rate 76 without any ST elevation no T wave inversion, left posterior fascicular block, normal intervals.  RADIOLOGY I have reviewed the xray personally and interpreted no edema, pneumonia   PROCEDURES:  Critical Care performed: No  .1-3 Lead EKG Interpretation  Performed by: Ernest Ronal BRAVO, MD Authorized by: Ernest Ronal BRAVO, MD     Interpretation: normal     ECG rate:  60  ECG rate assessment: normal     Rhythm: sinus rhythm     Ectopy: none     Conduction: normal      MEDICATIONS ORDERED IN ED: Medications - No data to display   IMPRESSION / MDM / ASSESSMENT AND PLAN / ED COURSE  I reviewed the triage vital signs and the nursing notes.   Patient's presentation is most consistent with acute presentation with potential threat to life or bodily function.   Patient comes in with chest pain, palpitations.  Patient asymptomatic at this time.  Considered arrhythmia but EKG without any obvious evidence will keep on cardiac monitor will get cardiac markers x 2 given she reported last episode was around 10 AM.  Chest x-ray to evaluate for any pneumonia.  Considered PE but no evidence of DVT on exam and given resolution of symptoms this seems less likely she denies any risk factors for PE including long travel, recent surgery, estrogen use. Pt declined preg test as she is menopausal. No abdominal pain.  Thyroid  is normal BMP reassuring CBC shows slightly elevated  hemoglobin at 15.7.  Troponin was negative  2:11 PM Repeat troponin is negative.  Repeat evaluation patient continues deny any chest pain, shortness of breath   At this time given re-assuring workup I have considered CT imaging to rule out PE/Dissection but given history and physical exam these seem less likely and potential harm from CT would outweigh the probability of finding PE or Dissection.    I have considered admission for patient, but given re-assuring workup including EKG, troponin, and re-assuring vitals patient can follow up outpatient with cardiology.   Discussed with patient that I can not predict future heart attacks and that cardiology can evaluate for need for further workup including stress test.  Explained to patient that if there is a change in symptoms, worsening symptoms, or any other concerns that they should return for repeat evaluation to have repeat EKG/troponin. They expressed understanding.  ____________________________________________   Note:  This document was prepared using Dragon voice recognition software and may include unintentional dictation errors.   The patient is on the cardiac monitor to evaluate for evidence of arrhythmia and/or significant heart rate changes.      FINAL CLINICAL IMPRESSION(S) / ED DIAGNOSES   Final diagnoses:  Palpitations  Atypical chest pain     Rx / DC Orders   ED Discharge Orders     None        Note:  This document was prepared using Dragon voice recognition software and may include unintentional dictation errors.   Ernest Ronal BRAVO, MD 01/24/24 1413    Ernest Ronal BRAVO, MD 01/24/24 1414    Ernest Ronal BRAVO, MD 01/24/24 [phone]

## 2024-01-24 NOTE — Discharge Instructions (Addendum)
 Go to the emergency department for evaluation of her chest pain and heart palpitations.

## 2024-01-24 NOTE — ED Triage Notes (Signed)
 Patient to Urgent Care with complaints of heart palpitations/ chest pains/ dry mouth. Denies any SHOB.  Describes centralized, sharp chest pain that has now resolved. Reports she had a stressful morning which she believes caused her symptoms.   Symptoms started today.   Hx of HTN.

## 2024-01-24 NOTE — ED Provider Notes (Signed)
 Jill Cochran    CSN: [id number] Arrival date & time: 01/24/24  1024      History   Chief Complaint Chief Complaint  Patient presents with   Chest Pain    HPI Jill Cochran is a 53 y.o. female.  Patient presents with episode of sharp midsternal chest pain and heart palpitations this morning.  Patient states she woke up this morning feeling nervous.  She states her heart started beating fast and felt like heart palpitation which lasted at least 30 minutes.  She was driving and developed sudden onset of the chest pain.  She had to pull to the side of the road.  She had associated nausea and shortness of breath.  Her symptoms resolved after approximately 1 minute while sitting on the side of the road.  She attributes her symptoms to stress.  She denies current chest pain, shortness of breath, numbness, focal weakness.  No OTC medications taken today.  Her medical history includes hypertension, chronic renal failure, peripheral vascular disease, kidney stones, GERD.  The history is provided by the patient and medical records.    Past Medical History:  Diagnosis Date   Chronic renal failure 2006 or 2008   Secondary to Right  Kidney  renal calculus , right kidney functiopns at 23 percent per pt   Complication of anesthesia    likes scopolamine  patch   Depression    GERD (gastroesophageal reflux disease)    History of kidney stones    History of sleep apnea    never used cpap due to weight loss after surgery   Kidney stones    Peripheral vascular disease (HCC)    leaky vein in Rt leg. 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   Varicose veins of bilateral lower extremities with other complications 02/02/2013    Past Surgical History:  Procedure Laterality Date   BARIATRIC SURGERY     EXTRACORPOREAL SHOCK  WAVE LITHOTRIPSY Left 05/16/2020   Procedure: EXTRACORPOREAL SHOCK WAVE LITHOTRIPSY (ESWL);  Surgeon: Rosalind Zachary NOVAK, MD;  Location: [facility];  Service: Urology;  Laterality: Left;   KIDNEY SURGERY Right 2008   percutaneous nephrolithotomies   LAPAROSCOPIC GASTRIC SLEEVE RESECTION N/A 03/25/2020   Procedure: LAPAROSCOPIC GASTRIC SLEEVE RESECTION WITH HIATAL HERNIA REPAIR;  Surgeon: Gladis Cough, MD;  Location: [facility];  Service: General;  Laterality: N/A;   TONSILLECTOMY     as a child   UPPER GI ENDOSCOPY N/A 03/25/2020   Procedure: UPPER GI ENDOSCOPY;  Surgeon: Gladis Cough, MD;  Location: [facility];  Service: General;  Laterality: N/A;    OB History   No obstetric history on file.      Home Medications    Prior to Admission medications   Medication Sig Start Date End Date Taking? Authorizing Provider  indapamide (LOZOL) 2.5 MG tablet Take 2.5 mg by mouth daily.   Yes [provider]  omeprazole  (PRILOSEC) 40 MG capsule Take 1 capsule (40 mg total) by mouth in the morning and at bedtime. 10/28/23  Yes Abran Norleen SAILOR, MD  ALPRAZolam (XANAX) 0.25 MG tablet Take 0.25 mg by mouth daily as needed for anxiety.  02/09/20   [provider]  Cholecalciferol (VITAMIN D) 50 MCG (2000 UT) CAPS Take 2,000 Units by mouth daily.    [provider]  EPINEPHrine  0.3 mg/0.3 mL IJ SOAJ injection Inject 0.3 mg into the muscle as needed for anaphylaxis. 10/09/23   Raspet, Erin K, PA-C  FLUoxetine (PROZAC) 20 MG capsule TAKE 1 CAPSULE BY MOUTH ONCE DAILY **PATIENT PREFERS CAPSULES FOR COST-SAVINGS** Patient not taking: Reported on 10/09/2023    [provider]  metoprolol  tartrate (LOPRESSOR ) 100 MG tablet Take 1 tablet (100 mg total) by mouth once for 1 dose. PLEASE TAKE METOPROLOL  2  HOURS PRIOR TO CTA SCAN. 02/12/22 02/12/22  Loni Soyla LABOR, MD    Family History Family History  Problem Relation Age of Onset   Diabetes Mother    Heart disease  Mother    Diabetes Father    Heart disease Father    Esophageal cancer Father    Diabetes type I Father    Breast cancer Paternal Grandmother    Stomach cancer Neg Hx    Colon cancer Neg Hx     Social History Social History   Tobacco Use   Smoking status: Never   Smokeless tobacco: Never  Vaping Use   Vaping status: Never Used  Substance Use Topics   Alcohol use: Yes    Alcohol/week: 2.0 standard drinks of alcohol    Types: 2 Standard drinks or equivalent per week    Comment: ocassionally   Drug use: No     Allergies   Morphine  and codeine, Ciprofloxacin, and Sulfamethoxazole   Review of Systems Review of Systems  Respiratory:  Positive for shortness of breath. Negative for cough.   Cardiovascular:  Positive for chest pain and palpitations.  Gastrointestinal:  Positive for nausea. Negative for vomiting.  Neurological:  Negative for dizziness, syncope, facial asymmetry, speech difficulty, weakness, light-headedness and numbness.     Physical Exam Triage Vital Signs ED Triage Vitals  Encounter Vitals Group     BP      Girls Systolic BP Percentile      Girls Diastolic BP Percentile      Boys Systolic BP Percentile      Boys Diastolic BP Percentile      Pulse      Resp      Temp      Temp src      SpO2      Weight      Height      Head Circumference      Peak Flow      Pain Score      Pain Loc      Pain Education      Exclude from Growth Chart    No data found.  Updated Vital Signs BP 124/84   Pulse 97   Temp 98.1 F (36.7 C)   Resp 20   LMP 06/08/2010   SpO2 98%   Visual Acuity Right Eye Distance:   Left Eye Distance:   Bilateral Distance:    Right Eye Near:   Left Eye Near:    Bilateral Near:     Physical Exam Constitutional:      General: She is not in acute distress. HENT:     Mouth/Throat:     Mouth: Mucous membranes are dry.  Cardiovascular:     Rate and Rhythm: Normal rate and regular rhythm.     Heart sounds: Normal heart  sounds.  Pulmonary:     Effort: Pulmonary effort is normal. No respiratory distress.     Breath sounds: Normal breath sounds.  Musculoskeletal:     Right lower leg: No edema.  Left lower leg: No edema.  Neurological:     General: No focal deficit present.     Mental Status: She is alert and oriented to person, place, and time.     Cranial Nerves: No cranial nerve deficit.     Sensory: No sensory deficit.     Motor: No weakness.     Gait: Gait normal.      UC Treatments / Results  Labs (all labs ordered are listed, but only abnormal results are displayed) Labs Reviewed - No data to display  EKG   Radiology No results found.  Procedures Procedures (including critical care time)  Medications Ordered in UC Medications - No data to display  Initial Impression / Assessment and Plan / UC Course  I have reviewed the triage vital signs and the nursing notes.  Pertinent labs & imaging results that were available during my care of the patient were reviewed by me and considered in my medical decision making (see chart for details).   Chest pain, heart palpitations.  Afebrile and vital signs are stable.  EKG shows sinus rhythm, rate 88, no ST elevation, compared to previous from 2021.  Patient is currently asymptomatic but had a concerning episode of chest pain this morning.  Sending her to the ED for evaluation.  She is agreeable to this.  She feels stable to drive herself to [facility] ED.  Her husband will meet her there.  She declines EMS.  Final Clinical Impressions(s) / UC Diagnoses   Final diagnoses:  Chest pain, unspecified type  Heart palpitations     Discharge Instructions      Go to the emergency department for evaluation of her chest pain and heart palpitations.       ED Prescriptions   None    PDMP not reviewed this encounter.   Corlis Burnard DEL, NP 01/24/24 1054

## 2024-01-24 NOTE — ED Triage Notes (Signed)
 Pt sts that she has been having heart palpations/ chest pain this AM. Pt sts that she believes that it is anxiety however when she started to get the pain.

## 2024-01-24 NOTE — ED Notes (Signed)
 Patient requesting water, MD made aware.
